# Patient Record
Sex: Female | Born: 1985 | Race: White | Hispanic: Yes | Marital: Single | Smoking: Never smoker
Health system: Southern US, Community
[De-identification: ages and names within clinical notes are randomized; demographics above are authoritative.]

## PROBLEM LIST (undated history)

## (undated) ENCOUNTER — Inpatient Hospital Stay (HOSPITAL_COMMUNITY): Payer: Self-pay

## (undated) DIAGNOSIS — Z789 Other specified health status: Secondary | ICD-10-CM

## (undated) HISTORY — DX: Other specified health status: Z78.9

## (undated) HISTORY — PX: NO PAST SURGERIES: SHX2092

---

## 2012-09-24 NOTE — L&D Delivery Note (Signed)
Delivery Note At 6:45 AM a viable and healthy female was delivered via Vaginal, Spontaneous Delivery (Presentation: Left Occiput Transverse).  APGAR: 8, 9; weight .   Placenta status: Intact, Spontaneous.  Cord: 3 vessels with the following complications: None.  Cord pH: n/a  Anesthesia: Local for repair Episiotomy: None Lacerations: 2nd degree;Perineal Suture Repair: 3.0 vicryl Est. Blood Loss (mL): 300  Mom to postpartum.  Baby to nursery-stable.  Levert Feinstein 02/03/2013, 7:27 AM

## 2012-09-24 NOTE — L&D Delivery Note (Signed)
I have seen the patient with the resident/student and agree with the above.  [name], [name]  

## 2013-01-13 ENCOUNTER — Ambulatory Visit: Payer: Self-pay

## 2013-01-13 ENCOUNTER — Encounter: Payer: Self-pay | Admitting: Obstetrics & Gynecology

## 2013-01-13 DIAGNOSIS — Z3201 Encounter for pregnancy test, result positive: Secondary | ICD-10-CM

## 2013-01-13 LAB — POCT PREGNANCY, URINE: Preg Test, Ur: POSITIVE — AB

## 2013-01-28 ENCOUNTER — Ambulatory Visit (INDEPENDENT_AMBULATORY_CARE_PROVIDER_SITE_OTHER): Payer: Self-pay | Admitting: Family Medicine

## 2013-01-28 ENCOUNTER — Other Ambulatory Visit: Payer: Self-pay | Admitting: Obstetrics & Gynecology

## 2013-01-28 ENCOUNTER — Encounter: Payer: Self-pay | Admitting: Family Medicine

## 2013-01-28 VITALS — BP 108/67 | Temp 98.5°F | Wt 148.3 lb

## 2013-01-28 DIAGNOSIS — O0930 Supervision of pregnancy with insufficient antenatal care, unspecified trimester: Secondary | ICD-10-CM | POA: Insufficient documentation

## 2013-01-28 DIAGNOSIS — O0933 Supervision of pregnancy with insufficient antenatal care, third trimester: Secondary | ICD-10-CM

## 2013-01-28 NOTE — Progress Notes (Signed)
Pulse 86 Sure of lmp. Has not had any prenatal care or ultrasounds.  1hr gtt due today 1704. Needs all initial labs.

## 2013-01-28 NOTE — Patient Instructions (Addendum)
Llame a la clinica para su proxima cita medica. Debe volver en una semana.  Embarazo  Systems analyst trimestre  (Pregnancy - Third Trimester) El tercer trimestre del Psychiatrist (los ltimos 3 meses) es el perodo en el cual tanto usted como su beb crecen con ms rapidez. El beb alcanza un largo de aproximadamente 50 cm. y pesa entre 2,700 y 4,500 kg. El beb gana ms tejido graso y est listo para la vida fuera del cuerpo de la [place]. Mientras estn en el interior, los bebs tienen perodos de sueo y vigilia, Warehouse manager y tienen hipo. Quizs sienta pequeas contracciones del tero. Este es el falso trabajo de [place]. Tambin se las conoce como contracciones de [place] . Es como una prctica del parto. Los problemas ms habituales de esta etapa del embarazo incluyen mayor dificultad para respirar, hinchazn de las manos y los pies por retencin de lquidos y la necesidad de Geographical information systems officer con ms frecuencia debido a que el tero y el beb presionan sobre la vejiga.  EXAMENES PRENATALES   Durante los Manpower Inc, deber seguir realizndose anlisis de [place]. Estas pruebas se realizan para controlar su salud y la del beb. Los ARAMARK Corporation de sangre se Radiographer, therapeutic para The Northwestern Mutual niveles de algunos compuestos de la sangre (hemoglobina). La anemia (bajo nivel de hemoglobina) es frecuente durante el embarazo. Para prevenirla, se administran hierro y vitaminas. Tambin le tomarn nuevas anlisis para descartar diabetes. Podrn repetirle algunas de las Hovnanian Enterprises hicieron previamente.  En cada visita le medirn el tamao del tero. Esto permite asegurar que el beb se desarrolla adecuadamente, segn la fecha del embarazo.  Le controlarn la presin arterial en cada visita prenatal. Esto es para asegurarse de que no sufre toxemia.  Le harn un anlisis de orina en cada visita prenatal, para descartar infecciones, diabetes y la presencia de protenas.  Tambin en cada visita controlarn su peso. Esto se  realiza para asegurarse que aumenta de peso al ritmo indicado y que usted y su beb evolucionan normalmente.  En algunas ocasiones se realiza una prueba de ultrasonido para confirmar el correcto desarrollo y evolucin del beb. Esta prueba se realiza con ondas sonoras inofensivas para el beb, de modo que el profesional pueda calcular ms precisamente la fecha del [place].  Analice con su mdico los analgsicos y la anestesia que recibir durante el [place] de parto y [place].  Comente la posibilidad de que necesite una cesrea y qu anestesia se recibir.  Informe a su mdico si sufre violencia familiar mental o fsica. A veces, se indica la prueba especializada sin estrs, la prueba de tolerancia a las contracciones y el perfil biofsico para asegurarse de que el beb no tiene problemas. El estudio del lquido amnitico que rodea al beb se llama amniocentesis. El lquido amnitico se obtiene introduciendo una aguja en el vientre (abdomen ). En ocasiones se lleva a cabo cerca del final del embarazo, si es necesario inducir a un parto. En este caso se realiza para asegurarse que los pulmones del beb estn lo suficientemente maduros como para que pueda vivir fuera del tero. Si los pulmones no han madurado y es peligroso que el beb nazca, se Building services engineer a la madre una inyeccin de [place] , 1 a 2 [address]  [address] antes del [address]. Vivia Budge ayuda a que los pulmones del beb maduren y sea ms seguro su nacimiento.  CAMBIOS QUE OCURREN EN EL TERCER TRIMESTRE DEL EMBARAZO  Su organismo atravesar numerosos cambios durante el [place]. Estos pueden variar de  Neomia Dear persona a Educational psychologist. Converse con el profesional que la asiste acerca los cambios que usted note y que la preocupen.   Durante el ltimo trimestre probablemente sienta un aumento del apetito. Es normal tener "antojos" de Development worker, community. Esto vara de Neomia Dear persona a otra y de un embarazo a Therapist, art.  Podrn aparecer las primeras estras en las caderas, abdomen y [place].  Estos son cambios normales del cuerpo durante el [place]. No existen medicamentos ni ejercicios que puedan prevenir CarMax.  La constipacin puede tratarse con un laxante o agregando fibra a su dieta. Beber grandes cantidades de lquidos, tomar fibras en forma de vegetales, frutas y granos integrales es de gran [place].  Tambin es beneficioso practicar actividad fsica. Si ha sido una persona Engineer, mining, podr continuar con la Harley-Davidson de las actividades durante el mismo. Si ha sido [employer] Family Insurance, puede ser beneficioso que comience con un programa de ejercicios, Museum/gallery exhibitions officer. Consulte con el profesional que la asiste antes de comenzar un programa de ejercicios.  Evite el consumo de cigarrillos, el alcohol, los medicamentos no recetados y las "drogas de la calle" durante el Psychiatrist. Estas sustancias qumicas afectan la formacin y el desarrollo del beb. Evite estas sustancias durante todo el embarazo para asegurar el nacimiento de un beb sano.  Podr sentir dolor de espalda, tener vrices en las venas y hemorroides, o si ya los sufra, pueden [place].  Durante el tercer trimestre se cansar con ms facilidad, lo cual es normal.  Los movimientos del beb pueden ser ms fuertes y con ms frecuencia.  Puede que note dificultades para respirar normalmente.  El ombligo puede salir hacia afuera.  A veces sale Veterinary surgeon de las [place], que se llama Product manager.  Podr aparecer Neomia Dear secrecin mucosa con sangre. Esto suele ocurrir General Electric unos [address] y Neomia Dear semana antes del [place]. INSTRUCCIONES PARA EL CUIDADO EN EL HOGAR   Cumpla con las citas de control. Siga las indicaciones del mdico con respecto al uso de [place], los ejercicios y la dieta.  Durante el embarazo debe obtener nutrientes para usted y para su beb. Consuma alimentos balanceados a intervalos regulares. Elija alimentos como carne, pescado, [place] y otros productos lcteos descremados,  vegetales, frutas, panes integrales y cereales. El Office Depot informar cul es el aumento de peso ideal.  Las relaciones sexuales pueden continuarse hasta casi el final del embarazo, si no se presentan otros problemas como prdida prematura (antes de [place]) de lquido amnitico, hemorragia vaginal o dolor en el vientre (abdominal).  Realice Tesoro Corporation, si no tiene restricciones. Consulte con el profesional que la asiste si no sabe con certeza si determinados ejercicios son seguros. El mayor aumento de peso se producir en los ltimos 2 trimestres del Psychiatrist. El ejercicio ayuda a:  Engineering geologist.  Mantenerse en forma para el trabajo de parto y [place] .  Perder peso despus del parto.  Haga reposo con frecuencia, con las piernas elevadas, o segn lo necesite para evitar los calambres y el dolor de cintura.  Use un buen sostn o como los que se usan para hacer deportes para Paramedic la sensibilidad de las [place]. Tambin puede serle til si lo [place] mientras duerme. Si pierde Product manager, podr Parker Hannifin.  No utilice la baera con agua caliente, baos turcos y saunas.  Colquese el cinturn de seguridad cuando conduzca. Este la proteger a usted y al beb en caso de accidente.  Evite comer  carne cruda y el contacto con los utensilios y desperdicios de los gatos. Estos elementos contienen grmenes que pueden causar defectos de nacimiento en el beb.  Es fcil perder algo de orina durante el [place]. Apretar y Chief Operating Officer los msculos de la pelvis la ayudar con este problema. Practique detener la miccin cuando est en el bao. Estos son los mismos msculos que Development worker, international aid. Son TEPPCO Partners mismos msculos que utiliza cuando trata de evitar despedir gases. Puede practicar apretando estos msculos WellPoint, y repetir esto tres veces por da aproximadamente. Una vez que conozca qu msculos debe apretar, no realice estos ejercicios durante la  miccin. Puede favorecerle una infeccin si la orina vuelve hacia atrs.  Pida ayuda si tienen necesidades financieras, teraputicas o nutricionales. El profesional podr ayudarla con respecto a estas necesidades, o derivarla a otros especialistas.  Haga una lista de nmeros telefnicos de emergencia y tngalos disponibles.  Planifique como obtener ayuda de familiares o amigos cuando regrese a Programmer, applications hospital.  Hacer un ensayo sobre la partida al hospital.  Prompton clases prenatales con el padre para entender, practicar y hacer preguntas sobre el [place] de parto y el alumbramiento.  Preparar la habitacin del beb / busque Fatima Blank.  No viaje fuera de la ciudad a menos que sea absolutamente necesario y con el asesoramiento de su mdico.  Use slo zapatos de tacn bajo o sin tacn para tener mejor equilibrio y Automotive engineer cadas. USO DE MEDICAMENTOS Y CONSUMO DE DROGAS DURANTE EL [facility]   Tome las vitaminas apropiadas para esta etapa tal como se le indic. Las vitaminas deben contener un miligramo de cido flico. Guarde todas las vitaminas fuera del alcance de los nios. La ingestin de slo un par de vitaminas o tabletas que contengan hierro pueden ocasionar la Newmont Mining en un beb o en un nio pequeo.  Evite el uso de The Mutual of Omaha, incluyendo hierbas, medicamentos de [place], sin receta o que no hayan sido sugeridos por su mdico. Slo tome medicamentos de venta libre o medicamentos recetados para Chief Technology Officer, Environmental health practitioner o fiebre como lo indique su mdico. No tome aspirina, ibuprofeno (Motrin, Advil, Nuprin) o naproxeno (Aleve) excepto que su mdico se lo indique.  Infrmele al profesional si consume alguna droga.  El alcohol se relaciona con ciertos defectos congnitos. Incluye el sndrome de alcoholismo fetal. Debe evitar absolutamente el consumo de alcohol, en cualquier forma. El fumar produce baja tasa de natalidad y bebs prematuros.  Las drogas ilegales o de la  calle son muy perjudiciales para el beb. Estn absolutamente prohibidas. Un beb que nace de American Express, ser adicto al nacer. Ese beb tendr los mismos sntomas de abstinencia que un adulto. SOLICITE ATENCIN MDICA SI:  Tiene preguntas o preocupaciones relacionadas con el embarazo. Es mejor que llame para formular las preguntas si no puede esperar hasta la prxima visita, que sentirse preocupada por ellas.  DECISIONES ACERCA DE LA CIRCUNCISIN  Usted puede saber o no cul es el sexo de su beb. Si ya sabe que ser un varn, este es el momento de pensar acerca de la circuncisin. La circuncisin es la extirpacin del prepucio. Esta es la piel que cubre el extremo sensible del pene. No hay un motivo mdico que lo justifique. Generalmente la decisin se toma segn lo que sea popular en ese momento, o segn creencias religiosas. Podr conversar estos temas con su mdico o con el pediatra.  SOLICITE ATENCIN MDICA DE INMEDIATO SI:   La temperatura oral  le sube a ms de 102 F (38.9 C) o lo que su mdico le indique.  Tiene una prdida de lquido por la vagina (canal de parto). Si sospecha una ruptura de las [place], tmese la temperatura y llame al profesional para informarlo sobre esto.  Observa unas pequeas manchas, una hemorragia vaginal o elimina cogulos. Notifique al profesional acerca de la cantidad y de cuntos apsitos est utilizando.  Presenta un olor desagradable en la secrecin vaginal y observa un cambio en el color, de transparente a blanco.  Ha vomitado durante ms de 24 horas.  Siente escalofros o le sube la fiebre.  Le falta el aire.  Siente ardor al Beatrix Shipper.  Baja o sube ms de 2 libras (900 g), o segn lo indicado por el profesional que la asiste.  Observa que sbitamente se le hinchan el rostro, las manos, los pies o las piernas.  Siente dolor en el vientre (abdominal). Las Federal-Mogul en el ligamento redondo son Neomia Dear causa benigna frecuente de dolor abdominal  durante el embarazo. El profesional que la asiste deber evaluarla.  Presenta dolor de cabeza intenso que no se [place].  Tiene problemas visuales, visin doble o borrosa.  Si no siente los movimientos del beb durante ms de 1 hora. Si piensa que el beb no se mueve tanto como lo haca habitualmente, coma algo que Psychologist, clinical y Target Corporation lado izquierdo durante [place]. El beb debe moverse al menos 4  5 veces por hora. Comunquese inmediatamente si el beb se mueve menos que lo indicado.  Se cae, se ve involucrada en un accidente automovilstico o sufre algn tipo de traumatismo.  En su hogar hay violencia mental o fsica. Document Released: 06/20/2005 Document Revised: 03/11/2012 [facility] Patient Information 2013 [place], [place].  Ihor Dow y parto normal (Normal Labor and Delivery) Licensed conveyancer, su mdico debe estar seguro de que usted est en trabajo de [place]. Algunos signos son:  Puede haber eliminado el "tapn mucoso" antes que comience el trabajo de [place]. Se trata de una pequea cantidad de mucus con sangre.  Tiene contracciones uterinas regulares.  El Bank of America las contracciones se acorta.  Las molestias y Chief Technology Officer se hacen gradualmente ms intensos.  El dolor se ubica principalmente en la espalda.  Los dolores empeoran al Home Depot.  El cuello del tero (la apertura del tero se hace ms delgada, comienza a borrarse, y se abre (se dilata). Una vez que se encuentre en [place] parto y sea admitida en el hospital, el mdico har lo siguiente:  Un examen fsico completo.  Controlar sus signos vitales (presin arterial, pulso, temperatura y la frecuencia cardaca fetal).  Realizar un examen vaginal (usando un guante estril y lubricante para determinar:  La posicin (presentacin) del beb (ceflica [vertex] o nalgas primero).  El nivel (plano) de la cabeza del beb en el canal de parto.  El borramiento y dilatacin del cuello del  tero.  Le rasurarn el vello pbico y le aplicarn una enema segn lo considere el mdico y las circunstancias.  Generalmente se coloca un monitor electrnico sobre el abdomen. El monitor sigue la duracin e intensidad de las contracciones, as como la frecuencia cardaca del beb.  Generalmente, el profesional inserta una va intravenosa en el brazo para administrarle agua azucarada. Esta es una medida de precaucin, de modo que puedan administrarle rpidamente medicamentos durante el [place] de [place]. EL TRABAJO DE PARTO Y PARTO NORMALES SE DIVIDEN EN 3 ETAPAS: Primera etapa Comienzan las contracciones regulares  y el cuello comienza a borrarse y dilatarse. Esta etapa puede durar entre 3 y 15 horas. El final de la primera etapa se considera cuando el cuello est borrado en un 100% y se ha dilatado 10 cm. Le administrarn analgsicos por:  Inyeccin (morfina, demerol, etc.).  Anestesia regional (espinal, caudal o epidural, anestsicos colocados en diferentes regiones de la columna vertebral). Podrn administrarle medicamentos para el dolor en la regin paracervical, que consiste en la aplicacin de un anestsico inyectable en cada uno de los lados del cuello del tero. La embarazada puede requerir un "parto natural" , es decir no recibir United Parcel o anestesia durante el [place] de parto y [place]. Segunda etapa En este momento el beb baja a travs del canal de parto (vagina) y nace. Esto puede durar entre 1 y 4 horas. A medida que el beb asoma la cabeza por el canal de parto, podr sentir una sensacin similar a cuando mueve el intestino. Sentir el impulse de empujar con fuerza hasta que el nio salga. A medida que la cabecita baja, el mdico decidir si realiza una episiotoma (corte en el perineo y rea de la vagina) para evitar la ruptura de los tejidos). Luego del nacimiento del beb y la expulsin de la placenta, la episiotoma se sutura. En algunos casos se coloca a la madre una mscara  con xido nitroso para Research officer, political party respiracin y Engineer, materials. El final de la etapa 2 se produce cuando el beb ha salido completamente. Luego, cuando el cordn umbilical deja de pulsar, se pinza y se corta. Tercera etapa La tercera etapa comienza luego que el beb ha nacido y finaliza luego de la expulsin de la placenta. Generalmente esto lleva entre 5 y 30 minutos. Luego de la expulsin de la placenta, le aplicarn un medicamento por va intravenosa para ayudar a Engineer, materials y Psychiatric nurse. En la tercera etapa no hay dolor y generalmente no son necesarios los analgsicos. Si le han realizado una episiotoma, es el momento de Sales promotion account executive. Luego del parto, la mam es observada y controlada exhaustivamente durante 1  2 horas para verificar que no hay sangrado en el post parto (hemorragias). Si pierde The Progressive Corporation, le administrarn un medicamento para Engineer, manufacturing tero y Comptroller. Document Released: 08/23/2008 Document Revised: 12/03/2011  [facility] Patient Information 2013 [place], [place].  Uso de los anticonceptivos orales  (Oral Contraception Use) Los anticonceptivos orales son medicamentos que se utilizan para Location manager. Su funcin es ALLTEL Corporation ovarios liberen vulos. Las hormonas de los anticonceptivos orales hacen que el moco cervical se haga ms espeso, lo que evita que el esperma ingrese al tero. Tambin hacen que la membrana que tapiza el tero se vuelva ms fina, lo que no permite que el huevo fertilizado se adhiera a la pared del tero. Los anticonceptivos orales son muy efectivos cuando se toman exactamente como se prescriben. Sin embargo, los anticonceptivos orales no previenen contra las enfermedades de transmisin sexual (ETS). La prctica del sexo seguro, como el uso de preservativos, junto con los anticonceptivos [place], [place] a prevenir ese tipo de enfermedades. Antes de tomar la pldora, usted debe hacerse un examen fsico y un [place]. El  mdico podr indicarle anlisis de [place], si es necesario. El mdico se asegurar de que usted es una buena candidata para usar anticonceptivos orales. Converse con su mdico acerca de los posibles efectos secundarios de los anticonceptivos [place]. Cuando se inicia el uso de anticonceptivos [place], se pueden tomar durante 2 a  3 meses para que el cuerpo se adapte a los cambios en los niveles hormonales en el cuerpo.  CMO TOMAR LOS ANTICONCEPTIVOS ORALES  El mdico le indicar como comenzar a Surveyor, minerals ciclo de anticonceptivos orales. De lo contrario usted puede:   Psychiatric nurse. da del ciclo menstrual. No necesitar proteccin anticonceptiva adicional al Investment banker, operational.  Comenzar Financial risk analyst domingo luego de su perodo menstrual, o Medical laboratory scientific officer en que adquiere el Automatic Data. En estos casos deber EchoStar proteccin anticonceptiva The TJX Companies primeros 7 das del [place]. Luego de comenzar a tomar los anticonceptivos orales:   Si olvid de tomar 1 pldora, tmela tan pronto como lo recuerde. Tome la siguiente pldora a la hora habitual.  Si olvida tomar 2  ms pldoras, utilice un mtodo anticonceptivo adicional hasta que comience su prximo perodo menstrual.  Si utiliza el envase de 28 pldoras y [place] tomar 1 de las ltimas 7 (pldoras sin hormonas), sto no tiene [place]ar[place] manager. Simplemente deseche el resto de las pldoras que no contienen hormonas y comience un nuevo envase. No importa cuando comience a tomar los anticonceptivos, siempre empiece un nuevo envase el mismo da de la [place]. Tenga un envase extra de pldoras anticonceptivas y use un mtodo anticonceptivo adicional para el caso en que se olvide de tomar algunas pldoras o pierda la caja.  INSTRUCCIONES PARA EL CUIDADO DOMICILIARIO  No fume.  Siempre use un condn para protegerse de las enfermedades de transmisin sexual. Los anticonceptivos orales no protegen contra las enfermedades de transmisin sexual.  Research scientist (medical)  en un calendario las fechas en las que tiene sus perodos [place].  Lea la informacin y consejos que vienen con las pldoras. Pngase en contacto con el mdico siempre que tenga preguntas. SOLICITE ATENCIN MDICA SI:  Presenta nuseas o vmitos.  Tiene flujo o sangrado vaginal anormal.  Aparece una erupcin cutnea.  No tiene el perodo menstrual.  Pierde el cabello.  Necesita tratamiento por cambios en su estado de nimo o por depresin.  Se siente mareada al tomar la pldora.  Comienza a aparecer acn con el uso de los anticonceptivos orales.  Ardelle Anton. SOLICITE ATENCIN MDICA DE INMEDIATO SI:  Siente dolor en el pecho.  Le falta el aire.  Le duele mucho la cabeza y no puede Human resources officer.  Siente adormecimiento o tiene dificultad para hablar.  Tiene problemas de visin.  Presenta dolor, inflamacin o hinchazn en las piernas. Document Released: 08/30/2011 Document Revised: 12/03/2011 [facility] Patient Information 2013 [place], [place].  Lactancia materna  (Breastfeeding) Decidir Museum/gallery exhibitions officer es una de las mejores elecciones que puede hacer por usted y su beb. La informacin que se brinda a Psychologist, clinical dar una breve visin de los beneficios de la lactancia materna as como de las dudas ms frecuentes alrededor de ella.  LOS BENEFICIOS DE AMAMANTAR  Para el beb   La primera leche (calostro ) ayuda al mejor funcionamiento del sistema digestivo del beb.   La leche tiene anticuerpos que provienen de la madre y que ayudan a prevenir las infecciones en el beb.   El beb tiene una menor incidencia de asma, alergias y del sndrome de muerte sbita del lactante (SMSL).   Los nutrientes en la [place] materna son mejores para el beb que los preparados para lactantes y la [place] materna ayuda a un mejor desarrollo del cerebro del beb.   Los bebs amamantados tienen menos gases, clicos y estreimiento.  Para la mam   La lactancia materna  favorece  el desarrollo de un vnculo muy especial entre la madre y el beb.   Es ms conveniente, siempre disponible y a Landscape architect y Film/video editor.   Consume caloras en la madre y la ayuda a perder el peso ganado durante el [place].   Favorece la contraccin del tero a su tamao normal, de manera ms rpida y Berkshire Hathaway las hemorragias luego del [place].   Las M.D.C. Holdings que amamantan tienen menor riesgo de Geophysical data processor de mama.  FRECUENCIA DEL AMAMANTAMIENTO   Un beb sano, nacido a trmino, puede amamantarse con tanta frecuencia como cada hora, o espaciar las comidas cada tres horas.   Observe al beb cuando manifieste signos de hambre. Amamante a su beb si muestra signos de hambre. Esta frecuencia variar de un beb a otro.   Amamntelo tan seguido como el beb lo solicite, o cuando usted sienta la necesidad de Paramedic sus [place].   Despierte al beb si han pasado 3  4 horas desde la ltima comida.   El amamantamiento frecuente la ayudar a producir ms [place] y a Education officer, community de Engineer, mining en los pezones e hinchazn de las [place].  POSICIN DEL BEBE PARA EL AMAMANTAMIENTO   Ya sea que se encuentre [place] o sentada, asegrese que el abdomen del beb enfrente el suyo.   Sostenga la mama con el pulgar por arriba y los otros 4 dedos por debajo. Asegrese que sus dedos se encuentren lejos del pezn y de la boca del beb.   Empuje suavemente los labios del beb con el pezn o con el dedo.   Cuando la boca del beb se abra lo suficiente, introduzca el pezn y la areola tanto como le sea posible dentro de la boca.   Coloque al beb cerca suyo de modo que su nariz y mejillas toquen las mamas al Texas Instruments.  ALIMENTACIN Y SUCCIN   La duracin de cada comida vara de un beb a otro y de Neomia Dear comida a [place].   El beb debe succionar entre 2 y 3 minutos para que Development worker, international aid. Esto se denomina "bajada". Por este motivo, permita que el nio se alimente en cada mama  todo lo que desee. Terminar de mamar cuando haya recibido la cantidad [place] de nutrientes.   Para detener la succin coloque su dedo en la comisura de la boca del nio y Midwife entre sus encas antes de quitarle la mama de la boca. Esto la ayudar a English as a second language teacher.  COMO SABER SI EL BEB OBTIENE LA SUFICIENTE LECHE MATERNA  Preguntarse si el beb obtiene la cantidad suficiente de [place] es una preocupacin frecuente [employer]. Puede asegurarse que el beb tiene la leche suficiente si:   El beb succiona activamente y usted escucha que traga .   El beb parece estar relajado y satisfecho despus de Psychologist, clinical.   El nio se alimenta al menos 8 a 12 veces en 24 horas. Alimntelo hasta que se desprenda por sus propios medios o se quede dormido en la primera mama (al menos durante 10 a 20 minutos), luego ofrzcale el otro lado.   El beb moja 5 a 6 paales desechables (6 a 8 paales de tela) en 24 horas cuando tiene 5  6 das de vida.   Tiene al [employer] 3 a 4 deposiciones todos los [employer] primeros meses. La materia fecal debe ser blanda y [place].   El beb debe aumentar 4 a 6 libras (120 a 170 gr.) por semana despus  de los 4 [address]   Siente que las mamas se ablandan despus de amamantar  REDUCIR LA CONGESTIN DE LAS MAMAS   Durante la primera semana despus del [place], usted puede experimentar hinchazn en las mamas. Cuando las mamas estn congestionadas, se sienten calientes, llenas y molestas al tacto. Puede reducir la congestin si:   Lo amamanta frecuentemente, cada 2-3 horas. Las mams que [employer] pronto y con frecuencia tienen menos problemas de [place].   Coloque compresas de hielo en sus mamas durante 10-20 minutos entre cada amamantamiento. Esto ayuda a Building services engineer. Envuelva las bolsas de hielo en una toalla liviana para proteger su piel. Las bolsas de vegetales congelados funcionan bien para este propsito.   Tome una  ducha tibia o aplique compresas hmedas calientes en las mamas durante 5 a 10 minutos antes de cada vez que [place]. Esto aumenta la circulacin y [place] a que la [place].   Masajee suavemente la mama antes y Psychologist, sport and exercise. Con las puntas de los dedos, masajee desde la pared torcica hacia abajo hasta llegar al pezn, con movimientos circulares.   Asegrese que el nio vaca al menos una mama antes de cambiar de lado.   Use un sacaleche para vaciar la mama si el beb se duerme o no se alimenta bien. Tambin podr [employer] con esa bomba si tiene que volver al trabajo o siente que las mamas estn congestionadas.   Evite los biberones, chupetes o complementar la alimentacin con agua o jugos en lugar de la [place]. La leche materna es todo el alimento que el beb necesita. No es necesario que el nio ingiera agua o preparados de bibern. Louann [place], es lo mejor para ayudar a que las mamas produzcan ms [place]. no darle suplementos al [employer] las primeras semanas.   Verifique que el beb se encuentra en la posicin correcta mientras lo alimenta.   Use un sostn que soporte bien sus mamas y [employer] que tienen aro.   Consuma una dieta balanceada y beba lquidos en cantidad.   Descanse con frecuencia, reljese y tome sus vitaminas prenatales para evitar la fatiga, el estrs y la anemia.  Si sigue estas indicaciones, la congestin debe mejorar en 24 a 48 horas. Si an tiene dificultades, consulte a Barista.  CUDESE USTED MISMA  Cuide sus mamas.   Bese o dchese diariamente.   Evite usar [employer].   Comience a amamantar del lado izquierdo en una comida y del lado derecho en la siguiente.   Notar que aumenta el flujo de [place] a los 2 a 5 [address]  [address] despus del [address]. Puede sentir algunas molestias por la congestin, lo que hace que sus mamas estn duras y sensibles. La congestin disminuye en 24 a 48 horas. Mientras tanto, aplique  toallas hmedas calientes durante 5 a 10 minutos antes de amamantar. Un masaje suave y la extraccin de un poco de leche antes de Museum/gallery exhibitions officer ablandarn las mamas y har ms fcil que el beb se agarre.   Use un buen sostn y seque al aire los pezones durante 3 a 4 minutos luego de Wellsite geologist.   Solo utilice apsitos de algodn.   Utilice lanolina [employer] pezones luego de [place]. No necesita lavarlos luego de alimentar al [employer]. Otra opcin es exprimir algunas gotas de [place] y [employer] pezones.  Cumpla con estos cuidados   Consuma alimentos bien balanceados y refrigerios nutritivos.   Albesa Seen  leche, jugos de fruta y agua para Warehouse manager sed (alrededor de 8 vasos por Futures trader).   Descanse lo suficiente.  Evite los alimentos que usted note que pueden afectar al beb.  SOLICITE ATENCIN MDICA SI:   Tiene dificultad con la lactancia materna y [place].   Tiene una zona de color rojo, dura y dolorosa en la mama que se acompaa de [place].   El beb est muy somnoliento como para alimentarse bien o tiene problemas para dormir.   Su beb moja menos de 6 paales al [address], a los 5 [address]  [address] de vida.   La piel del beb o la parte blanca de sus ojos est ms amarilla de lo que estaba en el hospital.   Se siente deprimida.  Document Released: 09/10/2005 Document Revised: 03/11/2012 [facility] Patient Information 2013 [place], [place].

## 2013-01-28 NOTE — Progress Notes (Signed)
  SUBJECTIVE:   Katelyn Joseph is being seen today for her first obstetrical visit.  This is a planned pregnancy. She is at [redacted]w[redacted]d gestation by LMP 05/16/12 (regular periods, sure of dates). Given EDD of 02/18/13. Her obstetrical history is significant for late prenatal care. She just moved here from [place] where she has prenatal care starting at 1 month of pregnancy. Her last prenatal visit there was 12/24/12. No ultrasounds in [place]. Relationship with FOB: significant other, living together. Patient does intend to breast feed. Pregnancy history fully reviewed.  Denies contractions, bleeding, loss of fluid, vaginal discharge, dysuria, nausea, vomiting. Does have occasional reflux, history of gastritis. No diarrhea or constipation. No fever/chills. Menstrual History: OB History   Grav Para Term Preterm Abortions TAB SAB Ect Mult Living   1               Patient's last menstrual period was 05/16/2012.   Has never had a pap smear. Denies history of STDs or herpes.  I have reviewed past medical and surgical history, OB history, family and social history, medications and allergies.  Review of Systems Pertinent positives and negatives mentioned in HPI.    Objective:   GEN:  WNWD, no distress HEENT:  NCAT, EOMI, conjunctiva clear NECK:  Supple, non-tender, no thyromegaly, trachea midline CV: RRR, no murmur RESP:  CTAB ABD:  Soft, non-tender, no guarding or rebound, normal bowel sounds EXTREM:  Warm, well perfused, no edema or tenderness NEURO:  Alert, oriented, no focal deficits GU:  Normal external genitalia. Normal vagina. Minimal vaginal discharge. Normal non-parous cervix. No CMT or adnexal tenderness. External os dilated to 1 cm. Cervix fairly long and high.  Assessment:    Pregnancy at [redacted]w[redacted]d  Late prenatal care    Plan:    Initial labs drawn including 1 hour GTT Prenatal vitamins. Problem list reviewed and updated. AFP3 discussed: too late. Role of ultrasound in  pregnancy discussed; fetal survey: ordered. Amniocentesis discussed: not indicated. Follow up in 1 weeks. Labor precautions reviewed. 50% of 45 min visit spent on counseling and coordination of care.

## 2013-01-29 LAB — GLUCOSE TOLERANCE, 1 HOUR (50G) W/O FASTING: Glucose, 1 Hour GTT: 93 mg/dL (ref 70–140)

## 2013-01-30 ENCOUNTER — Ambulatory Visit (HOSPITAL_COMMUNITY)
Admission: RE | Admit: 2013-01-30 | Discharge: 2013-01-30 | Disposition: A | Discharge status: Home / Self Care | Payer: Self-pay | Source: Ambulatory Visit | Attending: Family Medicine | Admitting: Family Medicine

## 2013-01-30 ENCOUNTER — Encounter: Payer: Self-pay | Admitting: Family Medicine

## 2013-01-30 DIAGNOSIS — Z363 Encounter for antenatal screening for malformations: Secondary | ICD-10-CM | POA: Insufficient documentation

## 2013-01-30 DIAGNOSIS — O0930 Supervision of pregnancy with insufficient antenatal care, unspecified trimester: Secondary | ICD-10-CM | POA: Insufficient documentation

## 2013-01-30 DIAGNOSIS — Z1389 Encounter for screening for other disorder: Secondary | ICD-10-CM | POA: Insufficient documentation

## 2013-01-30 DIAGNOSIS — O358XX0 Maternal care for other (suspected) fetal abnormality and damage, not applicable or unspecified: Secondary | ICD-10-CM | POA: Insufficient documentation

## 2013-01-30 DIAGNOSIS — O0933 Supervision of pregnancy with insufficient antenatal care, third trimester: Secondary | ICD-10-CM

## 2013-01-30 LAB — OBSTETRIC PANEL
Antibody Screen: NEGATIVE
Basophils Relative: 0 % (ref 0–1)
HCT: 30.2 % — ABNORMAL LOW (ref 36.0–46.0)
Hemoglobin: 10.2 g/dL — ABNORMAL LOW (ref 12.0–15.0)
Hepatitis B Surface Ag: NEGATIVE
Lymphs Abs: 1.5 10*3/uL (ref 0.7–4.0)
MCH: 27.3 pg (ref 26.0–34.0)
MCHC: 33.8 g/dL (ref 30.0–36.0)
Monocytes Absolute: 0.4 10*3/uL (ref 0.1–1.0)
Monocytes Relative: 5 % (ref 3–12)
Neutro Abs: 4.9 10*3/uL (ref 1.7–7.7)
Neutrophils Relative %: 71 % (ref 43–77)
RBC: 3.74 MIL/uL — ABNORMAL LOW (ref 3.87–5.11)
Rh Type: POSITIVE

## 2013-01-30 LAB — HEMOGLOBINOPATHY EVALUATION: Hemoglobin Other: 0 %

## 2013-01-31 LAB — CULTURE, OB URINE

## 2013-02-01 ENCOUNTER — Encounter: Payer: Self-pay | Admitting: Family Medicine

## 2013-02-03 ENCOUNTER — Encounter (HOSPITAL_COMMUNITY): Payer: Self-pay

## 2013-02-03 ENCOUNTER — Inpatient Hospital Stay (HOSPITAL_COMMUNITY)
Admission: AD | Admit: 2013-02-03 | Discharge: 2013-02-05 | DRG: 775 | Disposition: A | Discharge status: Home / Self Care | Payer: Medicaid Other | Source: Ambulatory Visit | Attending: Obstetrics & Gynecology | Admitting: Obstetrics & Gynecology

## 2013-02-03 DIAGNOSIS — O99893 Other specified diseases and conditions complicating puerperium: Secondary | ICD-10-CM | POA: Diagnosis not present

## 2013-02-03 DIAGNOSIS — H5710 Ocular pain, unspecified eye: Secondary | ICD-10-CM

## 2013-02-03 DIAGNOSIS — H1130 Conjunctival hemorrhage, unspecified eye: Secondary | ICD-10-CM | POA: Diagnosis not present

## 2013-02-03 DIAGNOSIS — O9989 Other specified diseases and conditions complicating pregnancy, childbirth and the puerperium: Secondary | ICD-10-CM

## 2013-02-03 DIAGNOSIS — R233 Spontaneous ecchymoses: Secondary | ICD-10-CM | POA: Diagnosis not present

## 2013-02-03 LAB — TYPE AND SCREEN
ABO/RH(D): AB POS
Antibody Screen: NEGATIVE

## 2013-02-03 LAB — CBC
Hemoglobin: 10.8 g/dL — ABNORMAL LOW (ref 12.0–15.0)
MCH: 27.8 pg (ref 26.0–34.0)
MCHC: 34 g/dL (ref 30.0–36.0)
MCV: 82 fL (ref 78.0–100.0)
RBC: 3.88 MIL/uL (ref 3.87–5.11)

## 2013-02-03 LAB — OB RESULTS CONSOLE GBS: GBS: NEGATIVE

## 2013-02-03 LAB — ABO/RH: ABO/RH(D): AB POS

## 2013-02-03 LAB — RPR: RPR Ser Ql: NONREACTIVE

## 2013-02-03 MED ORDER — ACETAMINOPHEN 325 MG PO TABS: 650.0000 mg | ORAL_TABLET | ORAL | Status: DC | PRN

## 2013-02-03 MED ORDER — ZOLPIDEM TARTRATE 5 MG PO TABS: 5.0000 mg | ORAL_TABLET | Freq: Every evening | ORAL | Status: DC | PRN

## 2013-02-03 MED ORDER — ONDANSETRON HCL 4 MG/2ML IJ SOLN: 4.0000 mg | Freq: Four times a day (QID) | INTRAMUSCULAR | Status: DC | PRN

## 2013-02-03 MED ORDER — FENTANYL CITRATE 0.05 MG/ML IJ SOLN
100.0000 ug | INTRAMUSCULAR | Status: DC | PRN
  Administered 2013-02-03 (×2): 100 ug via INTRAVENOUS
  Filled 2013-02-03 (×2): qty 2

## 2013-02-03 MED ORDER — CITRIC ACID-SODIUM CITRATE 334-500 MG/5ML PO SOLN: 30.0000 mL | ORAL | Status: DC | PRN

## 2013-02-03 MED ORDER — IBUPROFEN 600 MG PO TABS: 600.0000 mg | ORAL_TABLET | Freq: Four times a day (QID) | ORAL | Status: DC | PRN

## 2013-02-03 MED ORDER — TETANUS-DIPHTH-ACELL PERTUSSIS 5-2.5-18.5 LF-MCG/0.5 IM SUSP
0.5000 mL | Freq: Once | INTRAMUSCULAR | Status: AC
  Administered 2013-02-04: 0.5 mL via INTRAMUSCULAR

## 2013-02-03 MED ORDER — SIMETHICONE 80 MG PO CHEW: 80.0000 mg | CHEWABLE_TABLET | ORAL | Status: DC | PRN

## 2013-02-03 MED ORDER — LANOLIN HYDROUS EX OINT: TOPICAL_OINTMENT | CUTANEOUS | Status: DC | PRN

## 2013-02-03 MED ORDER — OXYCODONE-ACETAMINOPHEN 5-325 MG PO TABS: 1.0000 | ORAL_TABLET | ORAL | Status: DC | PRN

## 2013-02-03 MED ORDER — DIPHENHYDRAMINE HCL 25 MG PO CAPS: 25.0000 mg | ORAL_CAPSULE | Freq: Four times a day (QID) | ORAL | Status: DC | PRN

## 2013-02-03 MED ORDER — BENZOCAINE-MENTHOL 20-0.5 % EX AERO
1.0000 "application " | INHALATION_SPRAY | CUTANEOUS | Status: DC | PRN
  Filled 2013-02-03: qty 56

## 2013-02-03 MED ORDER — PRENATAL MULTIVITAMIN CH
1.0000 | ORAL_TABLET | Freq: Every day | ORAL | Status: DC
  Administered 2013-02-03 – 2013-02-05 (×3): 1 via ORAL
  Filled 2013-02-03 (×3): qty 1

## 2013-02-03 MED ORDER — OXYCODONE-ACETAMINOPHEN 5-325 MG PO TABS
1.0000 | ORAL_TABLET | ORAL | Status: DC | PRN
  Administered 2013-02-03: 1 via ORAL
  Filled 2013-02-03: qty 2

## 2013-02-03 MED ORDER — LIDOCAINE HCL (PF) 1 % IJ SOLN
30.0000 mL | INTRAMUSCULAR | Status: AC | PRN
  Administered 2013-02-03: 30 mL via SUBCUTANEOUS
  Filled 2013-02-03 (×2): qty 30

## 2013-02-03 MED ORDER — IBUPROFEN 600 MG PO TABS
600.0000 mg | ORAL_TABLET | Freq: Four times a day (QID) | ORAL | Status: DC
  Administered 2013-02-03 – 2013-02-05 (×10): 600 mg via ORAL
  Filled 2013-02-03 (×10): qty 1

## 2013-02-03 MED ORDER — ONDANSETRON HCL 4 MG PO TABS: 4.0000 mg | ORAL_TABLET | ORAL | Status: DC | PRN

## 2013-02-03 MED ORDER — LACTATED RINGERS IV SOLN: INTRAVENOUS | Status: DC

## 2013-02-03 MED ORDER — OXYTOCIN BOLUS FROM INFUSION
500.0000 mL | INTRAVENOUS | Status: DC
  Administered 2013-02-03: 500 mL via INTRAVENOUS

## 2013-02-03 MED ORDER — LACTATED RINGERS IV SOLN
500.0000 mL | INTRAVENOUS | Status: DC | PRN
Start: 2013-02-03 — End: 2013-02-03

## 2013-02-03 MED ORDER — DIBUCAINE 1 % RE OINT
1.0000 "application " | TOPICAL_OINTMENT | RECTAL | Status: DC | PRN
  Filled 2013-02-03: qty 28

## 2013-02-03 MED ORDER — WITCH HAZEL-GLYCERIN EX PADS: 1.0000 "application " | MEDICATED_PAD | CUTANEOUS | Status: DC | PRN

## 2013-02-03 MED ORDER — SENNOSIDES-DOCUSATE SODIUM 8.6-50 MG PO TABS
2.0000 | ORAL_TABLET | Freq: Every day | ORAL | Status: DC
  Administered 2013-02-03: 2 via ORAL

## 2013-02-03 MED ORDER — ONDANSETRON HCL 4 MG/2ML IJ SOLN: 4.0000 mg | INTRAMUSCULAR | Status: DC | PRN

## 2013-02-03 MED ORDER — OXYTOCIN 40 UNITS IN LACTATED RINGERS INFUSION - SIMPLE MED
62.5000 mL/h | INTRAVENOUS | Status: DC
  Filled 2013-02-03: qty 1000

## 2013-02-03 MED ORDER — POLYVINYL ALCOHOL 1.4 % OP SOLN
1.0000 [drp] | OPHTHALMIC | Status: DC | PRN
  Administered 2013-02-03: 1 [drp] via OPHTHALMIC
  Filled 2013-02-03: qty 15

## 2013-02-03 NOTE — Progress Notes (Signed)
Patient ID: Katelyn Joseph, female   DOB: 12/26/1985, 27 y.o.   MRN: [id number]   S:  Pt with bilateral red eyes and eye pain per RN.  Pt states her vision is fine but her eyes sting/burn.  O:   Filed Vitals:   02/03/13 1326  BP: 105/57  Pulse: 65  Temp: 98 F (36.7 C)  Resp: 20    EXAM:  Gen:  Alert,no distress HEENT:  NCAT Eyes:  Bilateral subconjunctival hemorrhages, normal pupils, EOMI Skin:  Petechial rash on neck and shoulders  A/P 26 y.o. G1P1001 PPD #0 following SVD. - Subjunctival hemorrhages bilaterally likely due to pushing given petechia around neck as well - Artificial tears for comfort, reassurance  Napoleon Form, MD

## 2013-02-03 NOTE — H&P (Signed)
Katelyn Joseph is a 27 y.o. female presenting for painful contractions.  The contractions started around midnight tonight. She was 2cm dilated a few days ago per pt.   This is her first baby. She goes to the clinic at [facility]. Denies having any problems during this pregnancy, or any medical problems in general.  Meds: prenatal vitamins Allergies: amoxicillin (dizzy)  History OB History   Grav Para Term Preterm Abortions TAB SAB Ect Mult Living   1 0 0 0 0 0 0 0 0 0      Past Medical History  Diagnosis Date  . Medical history non-contributory    Past Surgical History  Procedure Laterality Date  . No past surgeries     Family History: family history is not on file. Social History:  reports that she has never smoked. She has never used smokeless tobacco. She reports that she does not drink alcohol or use illicit drugs.   Prenatal Transfer Tool  Maternal Diabetes: No Genetic Screening: Declined - too late Maternal Ultrasounds/Referrals: Normal Fetal Ultrasounds or other Referrals:  None Maternal Substance Abuse:  No Significant Maternal Medications:  None Significant Maternal Lab Results:  None Other Comments:  initiated care at [redacted]w[redacted]d but had some prior care in [place]  ROS +contractions  Last menstrual period 05/16/2012. Exam Physical Exam  Gen: NAD Heart: RRR Lungs: CTAB, NWOB Abd: gravid but otherwise soft, nontender to palpation Ext: no appreciable lower extremity edema bilaterally Neuro: grossly nonfocal, speech intact GU:  Dilation: 5 Effacement (%): 90 Station: -1 Presentation: Vertex Exam by:: N Deal RN  Prenatal labs: ABO, Rh: AB/POS/-- (05/07 1715) Antibody: NEG (05/07 1715) Rubella: 17.50 (05/07 1715) RPR: NON REAC (05/07 1715)  HBsAg: NEGATIVE (05/07 1715)  HIV: NON REACTIVE (05/07 1715)  GBS: Negative (05/13 0000)   Assessment/Plan: Jena Tegeler is a 27 y.o. G1P0000 at [redacted]w[redacted]d who presents in active  labor.  -Admit to birthing suites for expectant management -GBS negative -may have epidural upon request -anticipate NSVD   Levert Feinstein 02/03/2013, 1:55 AM  .I have seen the patient with the resident/student and agree with the above.  Tawnya Crook

## 2013-02-03 NOTE — Progress Notes (Signed)
Interpreter at bedside.  Answered questions regarding care after delivery.  Ordered breakfast for patient.

## 2013-02-03 NOTE — Progress Notes (Signed)
UR chart review completed.  

## 2013-02-04 NOTE — Progress Notes (Signed)
Post Partum Day #1 Subjective: Katelyn Joseph is a 27 y.o. female who is up ad lib, voiding, tolerating PO, + flatus and complains of no vaginal pain.  She is still complaining of eye pain from subconjunctival hemorrages she received while delivering.  She states she has no dizziness on standing.  She plans to breast feed and will receive the depo shot for her birth control.    Objective: Blood pressure 108/69, pulse 59, temperature 98 F (36.7 C), temperature source Oral, resp. rate 18, height 5' 4.17" (1.63 m), weight 61 kg (134 lb 7.7 oz), last menstrual period 05/16/2012, unknown if currently breastfeeding.  Physical Exam:  General: alert, cooperative, appears stated age and no distress HEENT: bilateral subconjunctival hemorrhages, PERRLA CVS: RRR, good S1 and S2, no RGM Lungs: CTA bilaterally, no wheeze, crackles, or rhonchi Abdomen: Normoactive bowel sounds x4, soft with no tenderness to palpation.   Lochia: appropriate Uterine Fundus: firm DVT Evaluation: No evidence of DVT seen on physical exam.   Recent Labs  02/03/13 0135  HGB 10.8*  HCT 31.8*    Assessment/Plan: Plan for discharge tomorrow Will continue with PRN eye drops for eye pain   LOS: 1 day   Anna Genre 02/04/2013, 7:27 AM

## 2013-02-04 NOTE — Progress Notes (Signed)
Pt states that her eyes feel better than they did this morning, through her family friend who is present in the room.  Complains of no pain at this time.

## 2013-02-04 NOTE — Progress Notes (Signed)
I have seen and examined this patient and I agree with the above. Pt declines early d/c. Cam Hai 7:45 AM 02/04/2013

## 2013-02-05 MED ORDER — IBUPROFEN 600 MG PO TABS: 600.0000 mg | ORAL_TABLET | Freq: Four times a day (QID) | ORAL | Status: DC

## 2013-02-05 NOTE — Discharge Summary (Signed)
Attestation of Attending Supervision of Advanced Practitioner (CNM/NP): Evaluation and management procedures were performed by the Advanced Practitioner under my supervision and collaboration.  I have reviewed the Advanced Practitioner's note and chart, and I agree with the management and plan.  HARRAWAY-SMITH, Jena Tegeler 7:11 AM     

## 2013-02-05 NOTE — Progress Notes (Signed)
Pt discharged before CSW could assess the reason for [facility] @ 38 weeks.  UDS is negative.  CSW will continue to monitor drug screen results and make a referral if necessary.

## 2013-02-05 NOTE — H&P (Signed)
Agree with above note.  Katelyn Farino H. 02/05/2013 3:05 PM

## 2013-02-05 NOTE — Discharge Summary (Signed)
Obstetric Discharge Summary Reason for Admission: onset of labor Prenatal Procedures: NST Intrapartum Procedures: spontaneous vaginal delivery Postpartum Procedures: none Complications-Operative and Postpartum: none Hemoglobin  Date Value Range Status  02/03/2013 10.8* 12.0 - 15.0 g/dL Final     HCT  Date Value Range Status  02/03/2013 31.8* 36.0 - 46.0 % Final  Hospital Course: Katelyn Joseph is a 27 y.o. female presenting for painful contractions.  The contractions started around midnight tonight. She was 2cm dilated a few days ago per pt.  This is her first baby. She goes to the clinic at [facility]. Denies having any problems during this pregnancy, or any medical problems in general.  At 6:45 AM a viable and healthy female was delivered via Vaginal, Spontaneous Delivery (Presentation: Left Occiput Transverse). APGAR: 8, 9; weight .  Placenta status: Intact, Spontaneous. Cord: 3 vessels with the following complications: None. Cord pH: n/a  Anesthesia: Local for repair  Episiotomy: None  Lacerations: 2nd degree;Perineal  Suture Repair: 3.0 vicryl  Est. Blood Loss (mL): 300  She has done well postpartum and is ready for discharge  Physical Exam:  General: alert and no distress Lochia: appropriate Uterine Fundus: firm Incision: healing well DVT Evaluation: No evidence of DVT seen on physical exam.  Discharge Diagnoses: Term Pregnancy-delivered  Discharge Information: Date: 02/05/2013 Activity: unrestricted and pelvic rest Diet: routine Medications: PNV and Ibuprofen Condition: stable Instructions: refer to practice specific booklet Discharge to: home   Newborn Data: Live born female  Birth Weight: 6 lb 8.6 oz (2965 g) APGAR: 8, 9  Home with mother.  [facility] 02/05/2013, 6:32 AM

## 2013-02-10 ENCOUNTER — Encounter: Payer: Self-pay | Admitting: Advanced Practice Midwife

## 2013-03-04 ENCOUNTER — Ambulatory Visit: Payer: Self-pay | Admitting: Advanced Practice Midwife

## 2013-04-01 ENCOUNTER — Ambulatory Visit (INDEPENDENT_AMBULATORY_CARE_PROVIDER_SITE_OTHER): Payer: Self-pay | Admitting: Advanced Practice Midwife

## 2013-04-01 MED ORDER — NORETHINDRONE 0.35 MG PO TABS: 1.0000 | ORAL_TABLET | Freq: Every day | ORAL | Status: DC

## 2013-04-01 NOTE — Progress Notes (Signed)
Subjective:     Patient ID: Katelyn Joseph, female   DOB: 1986/02/27, 27 y.o.   MRN: [id number]  HPI  Katelyn Joseph is a 27 y.o. G1P1001 who is here today for PP check s/p NSVB. She had a perineal repair and denies any pain at this time. She states that her bleeding has stopped now, and that she bled for 20 days total. She is breastfeeding and states that is going well. She has not resumed intercourse at this time. She denies any depressive symptoms. She is planning on using pills for birth control.  Review of Systems     Objective:   Physical Exam  Nursing note and vitals reviewed. Constitutional: She is oriented to person, place, and time. She appears well-developed and well-nourished. No distress.  Cardiovascular: Normal rate.   Pulmonary/Chest: Effort normal.  Abdominal: Soft. There is no tenderness.  Genitourinary:  External: no lesion, repair is well healed.     Neurological: She is alert and oriented to person, place, and time.  Skin: Skin is warm and dry.  Psychiatric: She has a normal mood and affect.       Assessment:    6 weeks S/P NSVB Desires contraception     Plan:     [facility] Will come in for new BC if/when she weans

## 2013-04-01 NOTE — Patient Instructions (Signed)
Norethindrone tablets (contraception) Qu es este medicamento? La NORETINDRONA es un anticonceptivo oral. Este producto contiene una hormona femenina llamada progestina. Se utilizan para Location managerevitar el embarazo. Este medicamento puede ser utilizado para otros usos; si tiene alguna pregunta consulte con su proveedor de atencin mdica o con su farmacutico. Qu le debo informar a mi profesional de la salud antes de tomar este medicamento? Necesita saber si usted presenta alguno de los siguientes problemas o situaciones: -enfermedad vascular o cogulos sanguneos -cncer de mama, cervical o vaginal -diabetes -enfermedad cardiaca -enfermedad renal -enfermedad heptica -depresin mental -migraa -convulsiones -derrame cerebral -sangrado vaginal -reaccin alrgica o inusual a la noretindrona, a otros medicamentos, alimentos, colorantes o conservantes -si est embarazada o buscando quedar embarazada -si est amamantando a un beb Cmo debo utilizar este medicamento? Tome este medicamento por va oral con un vaso de agua. Puede tomar este medicamento con alimentos o sin alimentos. Siga las instrucciones de la etiqueta del Ilwacomedicamento. Tome PPL Corporationeste medicamento siempre a la misma hora cada da y en el orden indicado. No tome su medicamento con una frecuencia mayor a la indicada. Hable con su pediatra para informarse acerca del uso de este medicamento en nios. Puede requerir atencin especial. Este medicamentos ha sido usado en nias que han empezados a tener perodos Rivannamenstruales. Usted recibir un prospecto para el paciente para este producto con cada receta y relleno. Asegrese de leer este folleto cada vez cuidadosamente. Este folleto puede cambiar con frecuencia. Sobredosis: Pngase en contacto inmediatamente con un centro toxicolgico o una sala de urgencia si usted cree que haya tomado demasiado medicamento. ATENCIN: Reynolds AmericanEste medicamento es solo para usted. No comparta este medicamento con nadie. Qu  sucede si me olvido de una dosis? Trate de no olvidar ninguna dosis. Cada vez que se olvida de [place] dosis o que toma una dosis con un retraso aumentan sus posibilidades de quedar embarazada. Si olvida tomar Baker Hughes Incorporateduna pldora (an con slo 3 horas de Brewing technologistretraso), tome la pldora olvidada tan pronto como sea posible y Engineer, miningluego contine tomando las pldoras todos los [address]das en su horario normal (deber usar otro mtodo anticonceptivo adicional, durante las siguientes 48 horas). Siempre que olvide ms que 1 dosis, deber usar mtodos anticonceptivos adicionales hasta que termine el envase y tenga su prxima menstruacin. Tambin deber comunicarse con su profesional de la salud para recibir indicaciones cada vez que olvide ms de 1 dosis. Qu puede interactuar con este medicamento? No tome esta medicina con ninguno de los siguientes medicamentos: -amprenavir o fosamprenavir -bosentano Esta medicina tambin puede interactuar con los siguientes medicamentos: -antibiticos o medicamentos para infecciones, especialmente rifampicina, rifabutina, rifapentina y griseofulvina y posiblemente penicilina o tetraciclina -aprepitant -barbitricos, como fenobarbital -carbamazepina -felbamato -modafinilo -oxcarbazepina -fenitona -ritonavir u otros medicamentos para tratar el virus VIH o el SIDA -hierba de [place] -topiramato Puede ser que esta lista no menciona todas las posibles interacciones. Informe a su profesional de Beazer Homesla salud de In[employer] Micro Inctodos los productos a base de hierbas, medicamentos de Tiptonventa libre o suplementos nutritivos que est tomando. Si usted fuma, consume bebidas alcohlicas o si utiliza drogas ilegales, indqueselo tambin a su profesional de Beazer Homesla salud. Algunas sustancias pueden interactuar con su medicamento. A qu debo estar atento al usar PPL Corporationeste medicamento? Visite a su mdico o su profesional de la salud para chequear su evolucin peridicamente. Deber hacerse exmenes de las mamas y la pelvis y exmenes de  Papanicolaou en forma regular mientras est tomando este medicamento. Utilice un mtodo anticonceptivo adicional durante del primer ciclo que est tomando  estas tabletas. Si tiene algn motivo para pensar que est embarazada, deje de tomar este medicamento de inmediato y comunquese con su mdico o su profesional de Radiographer, therapeutic. Si toma este medicamento para problemas relacionados con las hormonas, pueden pasar varios ciclos hasta que observe mejoras en su condicin. Este medicamento no la protege de la infeccin por VIH (SIDA) ni de ninguna otra enfermedad de transmisin sexual. Qu efectos secundarios puedo tener al utilizar este medicamento? Efectos secundarios que debe informar a su mdico o a Producer, television/film/video de la salud tan pronto como sea posible: -secreciones o sensibilidad de las mamas -dolor en el abdomen, pecho, entrepierna o piernas -dolor de cabeza severo -erupcin cutnea, picazn o urticarias -falta repentina de aliento -cansancio o debilidad inusual -problemas en la visin o el habla -color amarillento de los ojos o de la piel Efectos secundarios que, por lo general, no requieren Psychologist, prison and probation services (debe informarlos a su mdico o a su profesional de la salud si persisten o si son molestos): -cambios en el deseo sexual -cambios en el sangrado vaginal -crecimiento de vello en el rostro -retencin de lquidos e hinchazn -dolor de cabeza -irritabilidad -nuseas -prdida o aumento del apetito Puede ser que esta lista no menciona todos los posibles efectos secundarios. Comunquese a su mdico por asesoramiento mdico [employer]. Usted puede informar los efectos secundarios a la FDA por telfono al 1-800-FDA-1088. Dnde debo guardar mi medicina? Mantngala fuera del alcance de los nios. Gurdela a [employer]I, entre 15 y 30 grados C (57 y 74 grados F). Deseche todo el medicamento que no haya utilizado, despus de la fecha de vencimiento. ATENCIN: Este  folleto es un resumen. Puede ser que no cubra toda la posible informacin. Si usted tiene preguntas acerca de esta medicina, consulte con su mdico, su farmacutico o su profesional de Radiographer, therapeutic.  2013, Elsevier/Gold Standard. (10/29/2008 4:03:54 PM)

## 2013-04-13 ENCOUNTER — Encounter (HOSPITAL_COMMUNITY): Payer: Self-pay

## 2013-04-13 ENCOUNTER — Emergency Department (HOSPITAL_COMMUNITY)
Admission: EM | Admit: 2013-04-13 | Discharge: 2013-04-13 | Disposition: A | Discharge status: Home / Self Care | Payer: Self-pay | Attending: Emergency Medicine | Admitting: Emergency Medicine

## 2013-04-13 DIAGNOSIS — M549 Dorsalgia, unspecified: Secondary | ICD-10-CM | POA: Insufficient documentation

## 2013-04-13 DIAGNOSIS — K2970 Gastritis, unspecified, without bleeding: Secondary | ICD-10-CM

## 2013-04-13 DIAGNOSIS — N390 Urinary tract infection, site not specified: Secondary | ICD-10-CM | POA: Insufficient documentation

## 2013-04-13 DIAGNOSIS — Z881 Allergy status to other antibiotic agents status: Secondary | ICD-10-CM | POA: Insufficient documentation

## 2013-04-13 DIAGNOSIS — R112 Nausea with vomiting, unspecified: Secondary | ICD-10-CM | POA: Insufficient documentation

## 2013-04-13 DIAGNOSIS — K2900 Acute gastritis without bleeding: Secondary | ICD-10-CM | POA: Insufficient documentation

## 2013-04-13 DIAGNOSIS — Z3202 Encounter for pregnancy test, result negative: Secondary | ICD-10-CM | POA: Insufficient documentation

## 2013-04-13 LAB — CBC WITH DIFFERENTIAL/PLATELET
Basophils Relative: 1 % (ref 0–1)
Eosinophils Absolute: 0.6 10*3/uL (ref 0.0–0.7)
Hemoglobin: 12.5 g/dL (ref 12.0–15.0)
MCH: 28.2 pg (ref 26.0–34.0)
MCHC: 34.7 g/dL (ref 30.0–36.0)
Monocytes Absolute: 0.3 10*3/uL (ref 0.1–1.0)
Monocytes Relative: 7 % (ref 3–12)
Neutrophils Relative %: 42 % — ABNORMAL LOW (ref 43–77)

## 2013-04-13 LAB — LIPASE, BLOOD: Lipase: 42 U/L (ref 11–59)

## 2013-04-13 LAB — URINALYSIS, ROUTINE W REFLEX MICROSCOPIC
Nitrite: NEGATIVE
Specific Gravity, Urine: 1.017 (ref 1.005–1.030)
Urobilinogen, UA: 0.2 mg/dL (ref 0.0–1.0)

## 2013-04-13 LAB — URINE MICROSCOPIC-ADD ON

## 2013-04-13 LAB — COMPREHENSIVE METABOLIC PANEL
Albumin: 3.7 g/dL (ref 3.5–5.2)
BUN: 18 mg/dL (ref 6–23)
Creatinine, Ser: 0.69 mg/dL (ref 0.50–1.10)
Potassium: 3.6 mEq/L (ref 3.5–5.1)
Total Protein: 6.6 g/dL (ref 6.0–8.3)

## 2013-04-13 LAB — POCT PREGNANCY, URINE: Preg Test, Ur: NEGATIVE

## 2013-04-13 MED ORDER — SODIUM CHLORIDE 0.9 % IV SOLN
1000.0000 mL | Freq: Once | INTRAVENOUS | Status: AC
  Administered 2013-04-13: 1000 mL via INTRAVENOUS

## 2013-04-13 MED ORDER — TRAMADOL HCL 50 MG PO TABS: 50.0000 mg | ORAL_TABLET | Freq: Four times a day (QID) | ORAL | Status: AC | PRN

## 2013-04-13 MED ORDER — NITROFURANTOIN MONOHYD MACRO 100 MG PO CAPS: 100.0000 mg | ORAL_CAPSULE | Freq: Two times a day (BID) | ORAL | Status: AC

## 2013-04-13 MED ORDER — ONDANSETRON HCL 4 MG/2ML IJ SOLN
4.0000 mg | Freq: Once | INTRAMUSCULAR | Status: AC
  Administered 2013-04-13: 4 mg via INTRAVENOUS
  Filled 2013-04-13: qty 2

## 2013-04-13 MED ORDER — LANSOPRAZOLE 30 MG PO CPDR: 30.0000 mg | DELAYED_RELEASE_CAPSULE | Freq: Every day | ORAL | Status: DC

## 2013-04-13 MED ORDER — LANSOPRAZOLE 30 MG PO CPDR: 30.0000 mg | DELAYED_RELEASE_CAPSULE | Freq: Every day | ORAL | Status: AC

## 2013-04-13 MED ORDER — SODIUM CHLORIDE 0.9 % IV SOLN
1000.0000 mL | INTRAVENOUS | Status: DC
  Administered 2013-04-13: 1000 mL via INTRAVENOUS

## 2013-04-13 MED ORDER — SULFAMETHOXAZOLE-TRIMETHOPRIM 800-160 MG PO TABS: 1.0000 | ORAL_TABLET | Freq: Two times a day (BID) | ORAL | Status: DC

## 2013-04-13 MED ORDER — MORPHINE SULFATE 4 MG/ML IJ SOLN
4.0000 mg | Freq: Once | INTRAMUSCULAR | Status: AC
  Administered 2013-04-13: 4 mg via INTRAVENOUS
  Filled 2013-04-13: qty 1

## 2013-04-13 NOTE — ED Provider Notes (Signed)
History    CSN: [id number] Arrival date & time 04/13/13  0303  First MD Initiated Contact with Patient 04/13/13 [phone]     Chief Complaint  Patient presents with  . Back Pain  . Abdominal Pain   (Consider location/radiation/quality/duration/timing/severity/associated sxs/prior Treatment) The history is provided by the patient.   27 year old female had onset this evening of severe epigastric pain and right flank pain. There is associated nausea and vomiting. She denies fever, chills, sweats. She denies urinary, frequency, tenesmus, dysuria. She denies constipation diarrhea. History reviewed. No pertinent past medical history. History reviewed. No pertinent past surgical history. No family history on file. History  Substance Use Topics  . Smoking status: Never Smoker   . Smokeless tobacco: Not on file  . Alcohol Use: Not on file   OB History   Grav Para Term Preterm Abortions TAB SAB Ect Mult Living                 Review of Systems  All other systems reviewed and are negative.    Allergies  Amoxicillin  Home Medications  No current outpatient prescriptions on file. BP 108/75  Pulse 51  Temp(Src) 97.7 F (36.5 C) (Oral)  Resp 20  SpO2 100% Physical Exam  Nursing note and vitals reviewed.  27 year old female, resting comfortably and in no acute distress. Vital signs are significant for bradycardia with heart rate of 51. Oxygen saturation is 100%, which is normal. Head is normocephalic and atraumatic. PERRLA, EOMI. Oropharynx is clear. Neck is nontender and supple without adenopathy or JVD. Back is nontender in the midline. There is moderate right CVA tenderness. Lungs are clear without rales, wheezes, or rhonchi. Chest is nontender. Heart has regular rate and rhythm without murmur. Abdomen is soft, flat, with moderate epigastric tenderness. No rebound or guarding. There are no masses or hepatosplenomegaly and peristalsis is normoactive. Extremities have no  cyanosis or edema, full range of motion is present. Skin is warm and dry without rash. Neurologic: Mental status is normal, cranial nerves are intact, there are no motor or sensory deficits.  ED Course  Procedures (including critical care time) Results for orders placed during the hospital encounter of 04/13/13  CBC WITH DIFFERENTIAL      Result Value Range   WBC 5.0  4.0 - 10.5 K/uL   RBC 4.43  3.87 - 5.11 MIL/uL   Hemoglobin 12.5  12.0 - 15.0 g/dL   HCT 11.9  14.7 - 82.9 %   MCV 81.3  78.0 - 100.0 fL   MCH 28.2  26.0 - 34.0 pg   MCHC 34.7  30.0 - 36.0 g/dL   RDW 56.2  13.0 - 86.5 %   Platelets 219  150 - 400 K/uL   Neutrophils Relative % 42 (*) 43 - 77 %   Neutro Abs 2.1  1.7 - 7.7 K/uL   Lymphocytes Relative 38  12 - 46 %   Lymphs Abs 1.9  0.7 - 4.0 K/uL   Monocytes Relative 7  3 - 12 %   Monocytes Absolute 0.3  0.1 - 1.0 K/uL   Eosinophils Relative 13 (*) 0 - 5 %   Eosinophils Absolute 0.6  0.0 - 0.7 K/uL   Basophils Relative 1  0 - 1 %   Basophils Absolute 0.1  0.0 - 0.1 K/uL  COMPREHENSIVE METABOLIC PANEL      Result Value Range   Sodium 140  135 - 145 mEq/L   Potassium 3.6  3.5 -  5.1 mEq/L   Chloride 105  96 - 112 mEq/L   CO2 27  19 - 32 mEq/L   Glucose, Bld 86  70 - 99 mg/dL   BUN 18  6 - 23 mg/dL   Creatinine, Ser 1.61  0.50 - 1.10 mg/dL   Calcium 9.1  8.4 - 09.6 mg/dL   Total Protein 6.6  6.0 - 8.3 g/dL   Albumin 3.7  3.5 - 5.2 g/dL   AST 16  0 - 37 U/L   ALT 10  0 - 35 U/L   Alkaline Phosphatase 83  39 - 117 U/L   Total Bilirubin 0.2 (*) 0.3 - 1.2 mg/dL   GFR calc non Af Amer >90  >90 mL/min   GFR calc Af Amer >90  >90 mL/min  LIPASE, BLOOD      Result Value Range   Lipase 42  11 - 59 U/L  URINALYSIS, ROUTINE W REFLEX MICROSCOPIC      Result Value Range   Color, Urine YELLOW  YELLOW   APPearance CLEAR  CLEAR   Specific Gravity, Urine 1.017  1.005 - 1.030   pH 5.5  5.0 - 8.0   Glucose, UA NEGATIVE  NEGATIVE mg/dL   Hgb urine dipstick NEGATIVE   NEGATIVE   Bilirubin Urine NEGATIVE  NEGATIVE   Ketones, ur NEGATIVE  NEGATIVE mg/dL   Protein, ur NEGATIVE  NEGATIVE mg/dL   Urobilinogen, UA 0.2  0.0 - 1.0 mg/dL   Nitrite NEGATIVE  NEGATIVE   Leukocytes, UA MODERATE (*) NEGATIVE  URINE MICROSCOPIC-ADD ON      Result Value Range   Squamous Epithelial / LPF FEW (*) RARE   WBC, UA 7-10  <3 WBC/hpf   Bacteria, UA FEW (*) RARE  POCT PREGNANCY, URINE      Result Value Range   Preg Test, Ur NEGATIVE  NEGATIVE   1. UTI (urinary tract infection)   2. Gastritis     MDM  Epigastric pain and right flank pain. It is not clear at this point whether her problem is predominantly GI or renal. Urinalysis will be obtained as well as labs and then decision made whether she should have CT scan to look for ureteral calculi.  At the time my shift was over, patient to your report still not come back so case was signed out to Dr. Ranae Palms to evaluate results.  Dione Booze, MD 04/14/13 [phone]

## 2013-04-13 NOTE — ED Notes (Addendum)
0324  Pt arrives to the room with family stating she is having back pain with nausea.  Started a few hrs ago.  0500  Pt is resting at this time.  Family at bedside

## 2013-04-13 NOTE — ED Notes (Signed)
Waiting for urinalysis to result. Pt made aware.

## 2013-04-13 NOTE — ED Notes (Signed)
Pt obtained new urine specimen, walked to lab and in progress.

## 2013-04-14 LAB — URINE CULTURE

## 2013-04-15 NOTE — ED Notes (Signed)
+   Urine Patient treated with Nitrofurantoin-sensitive to same-chart appended per protocol MD.  

## 2014-06-12 ENCOUNTER — Encounter (HOSPITAL_COMMUNITY): Payer: Self-pay | Admitting: Emergency Medicine

## 2014-06-12 ENCOUNTER — Emergency Department (HOSPITAL_COMMUNITY): Payer: Self-pay

## 2014-06-12 ENCOUNTER — Emergency Department (HOSPITAL_COMMUNITY)
Admission: EM | Admit: 2014-06-12 | Discharge: 2014-06-12 | Disposition: A | Discharge status: Home / Self Care | Payer: Self-pay | Attending: Emergency Medicine | Admitting: Emergency Medicine

## 2014-06-12 DIAGNOSIS — B3731 Acute candidiasis of vulva and vagina: Secondary | ICD-10-CM

## 2014-06-12 DIAGNOSIS — Z3202 Encounter for pregnancy test, result negative: Secondary | ICD-10-CM | POA: Insufficient documentation

## 2014-06-12 DIAGNOSIS — R109 Unspecified abdominal pain: Secondary | ICD-10-CM | POA: Insufficient documentation

## 2014-06-12 DIAGNOSIS — Z79899 Other long term (current) drug therapy: Secondary | ICD-10-CM | POA: Insufficient documentation

## 2014-06-12 DIAGNOSIS — R112 Nausea with vomiting, unspecified: Secondary | ICD-10-CM | POA: Insufficient documentation

## 2014-06-12 DIAGNOSIS — R1030 Lower abdominal pain, unspecified: Secondary | ICD-10-CM

## 2014-06-12 DIAGNOSIS — B373 Candidiasis of vulva and vagina: Secondary | ICD-10-CM

## 2014-06-12 DIAGNOSIS — M549 Dorsalgia, unspecified: Secondary | ICD-10-CM | POA: Insufficient documentation

## 2014-06-12 LAB — URINALYSIS, ROUTINE W REFLEX MICROSCOPIC
BILIRUBIN URINE: NEGATIVE
Glucose, UA: NEGATIVE mg/dL
HGB URINE DIPSTICK: NEGATIVE
Ketones, ur: NEGATIVE mg/dL
NITRITE: NEGATIVE
PROTEIN: NEGATIVE mg/dL
SPECIFIC GRAVITY, URINE: 1.029 (ref 1.005–1.030)
UROBILINOGEN UA: 0.2 mg/dL (ref 0.0–1.0)
pH: 8 (ref 5.0–8.0)

## 2014-06-12 LAB — URINE MICROSCOPIC-ADD ON

## 2014-06-12 LAB — CBC
HCT: 38.1 % (ref 36.0–46.0)
Hemoglobin: 13.3 g/dL (ref 12.0–15.0)
MCH: 28.4 pg (ref 26.0–34.0)
MCHC: 34.9 g/dL (ref 30.0–36.0)
MCV: 81.2 fL (ref 78.0–100.0)
PLATELETS: 270 10*3/uL (ref 150–400)
RBC: 4.69 MIL/uL (ref 3.87–5.11)
RDW: 12.9 % (ref 11.5–15.5)
WBC: 6.6 10*3/uL (ref 4.0–10.5)

## 2014-06-12 LAB — POC URINE PREG, ED: Preg Test, Ur: NEGATIVE

## 2014-06-12 LAB — WET PREP, GENITAL
CLUE CELLS WET PREP: NONE SEEN
TRICH WET PREP: NONE SEEN

## 2014-06-12 LAB — COMPREHENSIVE METABOLIC PANEL
ALT: 13 U/L (ref 0–35)
AST: 18 U/L (ref 0–37)
Albumin: 3.6 g/dL (ref 3.5–5.2)
Alkaline Phosphatase: 67 U/L (ref 39–117)
Anion gap: 14 (ref 5–15)
BUN: 11 mg/dL (ref 6–23)
CALCIUM: 9.3 mg/dL (ref 8.4–10.5)
CO2: 23 mEq/L (ref 19–32)
CREATININE: 0.55 mg/dL (ref 0.50–1.10)
Chloride: 103 mEq/L (ref 96–112)
GFR calc non Af Amer: >90 mL/min (ref >90)
Glucose, Bld: 101 mg/dL — ABNORMAL HIGH (ref 70–99)
Potassium: 3.7 mEq/L (ref 3.7–5.3)
SODIUM: 140 meq/L (ref 137–147)
TOTAL PROTEIN: 7 g/dL (ref 6.0–8.3)
Total Bilirubin: <0.2 mg/dL — ABNORMAL LOW (ref 0.3–1.2)

## 2014-06-12 MED ORDER — FLUCONAZOLE 100 MG PO TABS
150.0000 mg | ORAL_TABLET | Freq: Once | ORAL | Status: AC
  Administered 2014-06-12: 150 mg via ORAL
  Filled 2014-06-12: qty 2

## 2014-06-12 MED ORDER — MORPHINE SULFATE 4 MG/ML IJ SOLN
4.0000 mg | Freq: Once | INTRAMUSCULAR | Status: DC
  Filled 2014-06-12: qty 1

## 2014-06-12 MED ORDER — MORPHINE SULFATE 4 MG/ML IJ SOLN
4.0000 mg | Freq: Once | INTRAMUSCULAR | Status: AC
  Administered 2014-06-12: 4 mg via INTRAMUSCULAR

## 2014-06-12 NOTE — ED Notes (Signed)
Pt reports lower abd pain and back pain that started on 9/13. Having nausea and vomiting. Denies any urinary or vaginal symptoms.

## 2014-06-12 NOTE — ED Notes (Signed)
Patient transported to Ultrasound 

## 2014-06-12 NOTE — Discharge Instructions (Signed)
Dolor abdominal en las mujeres °(Abdominal Pain, Women) °El dolor abdominal (en el estómago, la pelvis o el vientre) puede tener muchas causas. Es importante que le informe a su médico: °· La ubicación del dolor. °· ¿Viene y se va, o persiste todo el tiempo? °· ¿Hay situaciones que inician el dolor (comer ciertos alimentos, la actividad física)? °· ¿Tiene otros síntomas asociados al dolor (fiebre, náuseas, vómitos, diarrea)? °Todo es de gran ayuda cuando se trata de hallar la causa del dolor. °CAUSAS °· Estómago: Infecciones por virus o bacterias, o úlcera. °· Intestino: Apendicitis (apéndice inflamado), ileitis regional (enfermedad de Crohn), colitis ulcerosa (colon inflamado), síndrome del colon irritable, diverticulitis (inflamación de los divertículos del colon) o cáncer de estómago oo intestino. °· Enfermedades de la vesícula biliar o cálculos. °· Enfermedades renales, cálculos o infecciones en el riñón. °· Infección o cáncer del páncreas. °· Fibromialgia (trastorno doloroso) °· Enfermedades de los órganos femeninos: °¨ Uterus: Útero: fibroma (tumor no canceroso) o infección °¨ Trompas de Falopio: infección o embarazo ectópico °¨ En los ovarios, quistes o tumores. °¨ Adherencias pélvicas (tejido cicatrizal). °¨ Endometriosis (el tejido que cubre el útero se desarrolla en la pelvis y los órganos pélvicos). °¨ Síndrome de congestión pélvica (los órganos femeninos se llenan de sangre antes del periodo menstrual( °¨ Dolor durante el periodo menstrual. °¨ Dolor durante la ovulación (al producir óvulos). °¨ Dolor al usar el DIU (dispositivo intrauterino para el control de la natalidad) °¨ Cáncer en los órganos femeninos. °· Dolor funcional (no está originado en una enfermedad, puede mejorar sin tratamiento). °· Dolor de origen psicológico °· Depresión. °DIAGNÓSTICO °Su médico decidirá la gravedad del dolor a través del examen físico °· Análisis de sangre °· Radiografías °· Ecografías °· TC (tomografía computada, tipo  especial de radiografías). °· IMR (resonancia magnética) °· Cultivos, en el caso una infección °· Colon por enema de bario (se inserta una sustancia de contraste en el intestino grueso para mejorar la observación con rayos X.) °· Colonoscopía (observación del intestino con un tubo luminoso). °· Laparoscopía (examen del interior del abdomen con un tubo que tiene una luz). °· Cirugía exploratoria abdominal mayor (se observa el abdomen realizando una gran incisión). °TRATAMIENTO °El tratamiento dependerá de la causa del problema.  °· Muchos de estos casos pueden controlarse y tratarse en casa. °· Medicamentos de venta libre indicados por el médico. °· Medicamentos con receta. °· Antibióticos, en caso de infección °· Píldoras anticonceptivas, en el caso de períodos dolorosos o dolor al ovular. °· Tratamiento hormonal, para la endometriosis °· Inyecciones para bloqueo nervioso selectivo. °· Fisioterapia. °· Antidepresivos. °· Consejos por parte de un psícólogo o psiquiatra. °· Cirugía mayor o menor. °INSTRUCCIONES PARA EL CUIDADO DOMICILIARIO °· No tome ni administre laxantes a menos que se lo haya indicado su médico. °· Tome analgésicos de venta libre sólo si se lo ha indicado el profesional que lo asiste. No tome aspirina, ya que puede causar molestias en el estómago o hemorragias. °· Consuma una dieta líquida (caldo o agua) según lo indicado por el médico. Progrese lentamente a una dieta blanda, según la tolerancia, si el dolor se relaciona con el estómago o el intestino. °· Tenga un termómetro y tómese la temperatura varias veces al día. °· Haga reposo en la cama y duerma, si esto alivia el dolor. °· Evite las relaciones sexuales, si le producen dolor. °· Evite las situaciones estresantes. °· Cumpla con las visitas y los análisis de control, según las indicaciones de su médico. °· Si el dolor   no se [employer] o la [place], [place] tratar con:  Acupuntura.  Ejercicios de relajacin (yoga,  meditacin).  Terapia grupal.  Psicoterapia. SOLICITE ATENCIN MDICA SI:  Nota que ciertos Pharmacist, community de [place].  El tratamiento indicado para Arboriculturist no Marketing executive.  Necesita analgsicos ms fuertes.  Quiere que le retiren el DIU.  Si se siente confundido o desfalleciente.  Presenta nuseas o vmitos.  Aparece una erupcin cutnea.  Sufre efectos adversos o una reaccin alrgica debido a los medicamentos que toma. SOLICITE ATENCIN MDICA DE INMEDIATO SI:  El dolor persiste o se agrava.  Tiene fiebre.  Siente el dolor slo en algunos sectores del abdomen. Si se localiza en la zona derecha, posiblemente podra tratarse de apendicitis. En un adulto, si se localiza en la regin inferior izquierda del abdomen, podra tratarse de colitis o diverticulitis.  Hay sangre en las heces (deposiciones de color rojo brillante o negro alquitranado), con o sin vmitos.  Usted presenta sangre en la orina.  Siente escalofros con o sin fiebre.  Se desmaya. ASEGRESE QUE:   Comprende estas instrucciones.  Controlar su enfermedad.  Solicitar ayuda de inmediato si no mejora o si empeora. Document Released: 12/27/2008 Document Revised: 12/03/2011 [facility] Patient Information 2015 [place], [place]. This information is not intended to replace advice given to you by your health care provider. Make sure you discuss any questions you have with your health care provider.  Dolor abdominal (Abdominal Pain) El dolor puede tener muchas causas. Normalmente la causa del dolor abdominal no es una enfermedad y Scientist, clinical (histocompatibility and immunogenetics) sin TEFL teacher. Frecuentemente puede controlarse y tratarse en casa. Su mdico le Medical sales representative examen fsico y posiblemente solicite anlisis de sangre y radiografas para ayudar a Chief Strategy Officer la gravedad de su dolor. Sin embargo, en [employer], debe transcurrir ms tiempo antes de que se pueda Clinical research associate una causa evidente del dolor. Antes de  llegar a ese punto, es posible que su mdico no sepa si necesita ms pruebas o un tratamiento ms profundo. INSTRUCCIONES PARA EL CUIDADO EN EL HOGAR  Est atento al dolor para ver si hay cambios. Las siguientes indicaciones ayudarn a Architectural technologist que pueda sentir:  Erhard solo medicamentos de venta libre o recetados, segn las indicaciones del mdico.  No tome laxantes a menos que se lo haya indicado su mdico.  Pruebe con Neomia Dear dieta lquida absoluta (caldo, t o agua) segn se lo indique su mdico. Introduzca gradualmente una dieta normal, segn su tolerancia. SOLICITE ATENCIN MDICA SI:  Tiene dolor abdominal sin explicacin.  Tiene dolor abdominal relacionado con nuseas o diarrea.  Tiene dolor cuando orina o defeca.  Experimenta dolor abdominal que lo despierta de noche.  Tiene dolor abdominal que empeora o mejora cuando come alimentos.  Tiene dolor abdominal que empeora cuando come alimentos grasosos.  Tiene fiebre. SOLICITE ATENCIN MDICA DE INMEDIATO SI:   El dolor no desaparece en un plazo mximo de 2horas.  No deja de (vomitar).  El Engineer, mining se siente solo en partes del abdomen, como el lado derecho o la parte inferior izquierda del abdomen.  Evaca materia fecal sanguinolenta o negra, de aspecto alquitranado. ASEGRESE DE QUE:  Comprende estas instrucciones.  Controlar su afeccin.  Recibir ayuda de inmediato si no mejora o si empeora. Document Released: 09/10/2005 Document Revised: 09/15/2013 [facility] Patient Information 2015 [place], [place]. This information is not intended to replace advice given to you by your health care provider. Make sure you discuss any questions  you have with your health care provider.  Vulvovaginitis Candidisica (Candidal Vulvovaginitis) La vulvovaginitis candidisica es una infeccin de la vagina y la vulva. La vulva es la piel que rodea la abertura de la vagina. Puede causar picazn y [place] de y alrededor  de la vagina.  CUIDADOS EN EL HOGAR  Slo tome medicamentos como lo indique su mdico.  No mantenga relaciones sexuales hasta que la infeccin haya curado o segn le indique el mdico.  Practique sexo seguro.  Informe a su compaero sexual acerca de su infeccin.  No tome duchas vaginales ni use tampones.  Use ropa interior de algodn. No utilice pantalones ni pantimedias ajustados.  Coma yogur. Esto puede ayudar a tratar y prevenir las infecciones por cndida. SOLICITE AYUDA DE INMEDIATO SI:   Tiene fiebre.  Los problemas empeoran durante el tratamiento, o si no mejora luego de [address].  Tiene malestar, irritacin, o picazn en la zona de la vagina o la vulva.  Siente dolor en al [employer]  Comienza a sentir dolor abdominal. ASEGRESE DE QUE:  Comprende estas instrucciones.  Controlar su enfermedad.  Solicitar ayuda de inmediato si no mejora o empeora. Document Released: 10/13/2010 Document Revised: 09/15/2013 [facility] Patient Information 2015 [place], [place]. This information is not intended to replace advice given to you by your health care provider. Make sure you discuss any questions you have with your health care provider.

## 2014-06-12 NOTE — ED Notes (Signed)
Patient transported to CT 

## 2014-06-12 NOTE — ED Provider Notes (Signed)
CSN: [id number]     Arrival date & time 06/12/14  1112 History   First MD Initiated Contact with Patient 06/12/14 1203     Chief Complaint  Patient presents with  . Abdominal Pain  . Back Pain     (Consider location/radiation/quality/duration/timing/severity/associated sxs/prior Treatment) HPI Comments: Pt is a 28 y/o female with no significant PMHx who presents to the ED complaining of lower abdominal pain "above my vagina" radiating towards her back x6 days beginning on 06/06/14. Pain described as cramping, intermittent with occasional episodes of more intense pain. Nothing in specific makes her pain come or go. No aggravating or alleviating factors. Admits to associated nausea with a few episodes of non-bloody, non-bilious emesis and slight lightheadedness. Denies increased urinary frequency, urgency, dysuria, vaginal bleeding or discharge, fever or chills. LMP began 2 weeks ago and was lighter than normal. She is on birth control. Sexually active with one partner only and does not use protection. States this feels similar to her prior pregnancy. She has not taken a pregnancy test.  Patient is a 28 y.o. female presenting with abdominal pain and back pain. The history is provided by the patient.  Abdominal Pain Associated symptoms: nausea and vomiting   Back Pain Associated symptoms: abdominal pain     Past Medical History  Diagnosis Date  . Medical history non-contributory    Past Surgical History  Procedure Laterality Date  . No past surgeries     History reviewed. No pertinent family history. History  Substance Use Topics  . Smoking status: Never Smoker   . Smokeless tobacco: Never Used  . Alcohol Use: No   OB History   Grav Para Term Preterm Abortions TAB SAB Ect Mult Living   0 0 0 0 0 0 1     Review of Systems  Gastrointestinal: Positive for nausea, vomiting and abdominal pain.  Musculoskeletal: Positive for back pain.  All other systems reviewed and are  negative.     Allergies  Amoxicillin and Mushroom extract complex  Home Medications   Prior to Admission medications   Medication Sig Start Date End Date Taking? Authorizing Provider  norethindrone (MICRONOR,CAMILA,ERRIN) 0.35 MG tablet Take 1 tablet (0.35 mg total) by mouth daily. 04/01/13  Yes Tawnya Crook, CNM  Prenatal Vit-Fe Fumarate-FA (PRENATAL MULTIVITAMIN) TABS Take 1 tablet by mouth daily at 12 noon.   Yes Historical Provider, MD   BP 101/52  Pulse 64  Temp(Src) 98 F (36.7 C) (Oral)  Resp 18  SpO2 98%  LMP 06/01/2014 Physical Exam  Nursing note and vitals reviewed. Constitutional: She is oriented to person, place, and time. She appears well-developed and well-nourished. No distress.  HENT:  Head: Normocephalic and atraumatic.  Mouth/Throat: Oropharynx is clear and moist.  Eyes: Conjunctivae are normal.  Neck: Normal range of motion. Neck supple.  Cardiovascular: Normal rate, regular rhythm and normal heart sounds.   Pulmonary/Chest: Effort normal and breath sounds normal.  Abdominal: Soft. Normal appearance and bowel sounds are normal. There is tenderness in the right upper quadrant, right lower quadrant, periumbilical area, suprapubic area and left lower quadrant. There is guarding and CVA tenderness (bilateral). There is no rigidity, no rebound and negative Murphy's sign.  No peritoneal signs.  Genitourinary: Uterus normal. Uterus is not deviated, not enlarged, not fixed and not tender. Cervix exhibits no motion tenderness, no discharge and no friability. Right adnexum displays no mass, no tenderness and no fullness. Left adnexum displays no mass, no tenderness and no  fullness. No erythema, tenderness or bleeding around the vagina. Vaginal discharge (scant, white) found.  Cervical os closed.  Musculoskeletal: Normal range of motion. She exhibits no edema.  Neurological: She is alert and oriented to person, place, and time.  Skin: Skin is warm and dry. She is  not diaphoretic.  Psychiatric: She has a normal mood and affect. Her behavior is normal.    ED Course  Procedures (including critical care time) Labs Review Labs Reviewed  WET PREP, GENITAL - Abnormal; Notable for the following:    Yeast Wet Prep HPF POC FEW (*)    WBC, Wet Prep HPF POC FEW (*)    All other components within normal limits  URINALYSIS, ROUTINE W REFLEX MICROSCOPIC - Abnormal; Notable for the following:    Leukocytes, UA SMALL (*)    All other components within normal limits  COMPREHENSIVE METABOLIC PANEL - Abnormal; Notable for the following:    Glucose, Bld 101 (*)    Total Bilirubin <0.2 (*)    All other components within normal limits  URINE MICROSCOPIC-ADD ON - Abnormal; Notable for the following:    Squamous Epithelial / LPF FEW (*)    Crystals TRIPLE PHOSPHATE CRYSTALS (*)    All other components within normal limits  GC/CHLAMYDIA PROBE AMP  CBC  POC URINE PREG, ED    Imaging Review Ct Abdomen Pelvis Wo Contrast  06/12/2014   CLINICAL DATA:  Lower abdominal and back pain with nausea and vomiting  EXAM: CT ABDOMEN AND PELVIS WITHOUT CONTRAST  TECHNIQUE: Multidetector CT imaging of the abdomen and pelvis was performed following the standard protocol without IV contrast.  COMPARISON:  None.  FINDINGS: The kidneys exhibit normal density and contour. No calcified stones are demonstrated. The perinephric fat is normal. There is no hydronephrosis nor hydroureter. The partially distended urinary bladder is normal.  The liver, pancreas, spleen, adrenal glands, and abdominal aorta are normal. The gallbladder is only partially distended. Tiny lucencies near the fundus may reflect gas containing stones. The stomach is moderately distended with food. The small and large bowel exhibit no evidence of ileus nor obstruction. The appendix is normal. There is no ascites. The uterus and adnexal structures are unremarkable. There is no inguinal hernia. There is a shallow umbilical  hernia.  The lung bases are clear. The lumbar spine and bony pelvis are unremarkable.  IMPRESSION: 1. There is no evidence of urinary tract stones nor urinary tract obstruction. No inflammatory changes are demonstrated. 2. There is no acute bowel abnormality. 3. The gallbladder is only partially distended but this is likely due to the fact the patient has recently ingested a meal. I cannot exclude tiny gas containing stones near the fundus. There is no CT evidence of acute cholecystitis. Gallbladder ultrasound could be considered if the patient's clinical symptoms might reflect gallbladder pathology. 4. The uterus and adnexal structures are grossly normal for the noncontrast technique.   Electronically Signed   By: David  [place]   On: 06/12/2014 13:57   US Abdomen Complete  06/12/2014   CLINICAL DATA:  Abdominal pain  EXAM: ULTRASOUND ABDOMEN COMPLETE  COMPARISON:  CT scan of the abdomen and pelvis of June 12, 2014.  FINDINGS: Gallbladder:  The gallbladder is adequately distended with no evidence of stones, wall thickening, or pericholecystic fluid. There is no positive sonographic Murphy's sign.  Common bile duct:  Diameter: 4 mm  Liver:  No focal lesion identified. Within normal limits in parenchymal echogenicity.  IVC:  No abnormality visualized.  Pancreas:  The pancreatic tail was obscured by bowel gas. The pancreatic head and body were unremarkable.  Spleen:  Size and appearance within normal limits.  Right Kidney:  Length: 10.7 cm. Echogenicity within normal limits. No mass or hydronephrosis visualized.  Left Kidney:  Length: 12.0 cm. Echogenicity within normal limits. No mass or hydronephrosis visualized.  Abdominal aorta:  No aneurysm visualized.  Other findings:  None.  IMPRESSION: There is no evidence of acute gallbladder pathology nor other acute intra-abdominal abnormality.   Electronically Signed   By: David  [place]   On: 06/12/2014 15:17     EKG Interpretation None      MDM   Final  diagnoses:  Vaginal yeast infection  Lower abdominal pain   Pt presenting with lower abdominal pain and back pain. She is non-toxic appearing and in NAD. AFVSS.  Symptoms feel similar to her prior pregnancy. Abdomen tender RUQ, RLQ, LLQ, suprapubic and periumbilical. Bilateral CVA tenderness. There is guarding but no peritoneal signs. Urine pregnancy negative. Scant vaginal discharge on speculum exam, no CMT. Doubt PID. UA resulted with triple phosphate crystals, and given CVA tenderness, CT w/o contrast obtained to evaluate for possible stones. CT negative for any stones, however gallbladder is partially distended and tiny gas containing stones near fundus cannot be excluded. Will obtain abdominal US for further evaluation. Normal LFTs. 4:04 PM Abdominal US normal. Wet prep with yeast. Diflucan tablet given in ED. Given UA equivocal, urine culture pending. Pt reports pain greatly improved with morphine. Repeat abdominal exam benign. Stable for d/c. Resources given for f/u. Return precautions given. Patient states understanding of treatment care plan and is agreeable.  Trevor Mace, PA-C 06/12/14 [phone]

## 2014-06-12 NOTE — ED Notes (Signed)
Patient returned from CT

## 2014-06-14 LAB — URINE CULTURE

## 2014-06-14 LAB — GC/CHLAMYDIA PROBE AMP
CT PROBE, AMP APTIMA: NEGATIVE
GC Probe RNA: NEGATIVE

## 2014-06-22 NOTE — ED Provider Notes (Signed)
Medical screening examination/treatment/procedure(s) were performed by non-physician practitioner and as supervising physician I was immediately available for consultation/collaboration.   EKG Interpretation None        Kimley Apsey, MD 06/22/14 1238 

## 2014-07-26 ENCOUNTER — Encounter (HOSPITAL_COMMUNITY): Payer: Self-pay | Admitting: Emergency Medicine

## 2015-04-10 ENCOUNTER — Inpatient Hospital Stay (HOSPITAL_COMMUNITY)
Admission: AD | Admit: 2015-04-10 | Discharge: 2015-04-10 | Disposition: A | Discharge status: Home / Self Care | Payer: Self-pay | Source: Ambulatory Visit | Attending: Obstetrics & Gynecology | Admitting: Obstetrics & Gynecology

## 2015-04-10 ENCOUNTER — Inpatient Hospital Stay (HOSPITAL_COMMUNITY): Payer: Self-pay

## 2015-04-10 ENCOUNTER — Encounter (HOSPITAL_COMMUNITY): Payer: Self-pay | Admitting: *Deleted

## 2015-04-10 DIAGNOSIS — O479 False labor, unspecified: Secondary | ICD-10-CM | POA: Insufficient documentation

## 2015-04-10 DIAGNOSIS — O4700 False labor before 37 completed weeks of gestation, unspecified trimester: Secondary | ICD-10-CM | POA: Insufficient documentation

## 2015-04-10 DIAGNOSIS — Z3A30 30 weeks gestation of pregnancy: Secondary | ICD-10-CM

## 2015-04-10 DIAGNOSIS — O26873 Cervical shortening, third trimester: Secondary | ICD-10-CM | POA: Insufficient documentation

## 2015-04-10 DIAGNOSIS — O99820 Streptococcus B carrier state complicating pregnancy: Secondary | ICD-10-CM | POA: Insufficient documentation

## 2015-04-10 DIAGNOSIS — O4703 False labor before 37 completed weeks of gestation, third trimester: Secondary | ICD-10-CM

## 2015-04-10 LAB — CBC
HEMATOCRIT: 31.3 % — AB (ref 36.0–46.0)
HEMOGLOBIN: 10.3 g/dL — AB (ref 12.0–15.0)
MCH: 26.7 pg (ref 26.0–34.0)
MCHC: 32.9 g/dL (ref 30.0–36.0)
MCV: 81.1 fL (ref 78.0–100.0)
Platelets: 332 10*3/uL (ref 150–400)
RBC: 3.86 MIL/uL — AB (ref 3.87–5.11)
RDW: 13.2 % (ref 11.5–15.5)
WBC: 13.2 10*3/uL — ABNORMAL HIGH (ref 4.0–10.5)

## 2015-04-10 LAB — URINALYSIS, ROUTINE W REFLEX MICROSCOPIC
Bilirubin Urine: NEGATIVE
GLUCOSE, UA: NEGATIVE mg/dL
Ketones, ur: NEGATIVE mg/dL
Nitrite: NEGATIVE
Protein, ur: NEGATIVE mg/dL
Specific Gravity, Urine: <1.005 — ABNORMAL LOW (ref 1.005–1.030)
Urobilinogen, UA: 0.2 mg/dL (ref 0.0–1.0)
pH: 5.5 (ref 5.0–8.0)

## 2015-04-10 LAB — WET PREP, GENITAL
Clue Cells Wet Prep HPF POC: NONE SEEN
TRICH WET PREP: NONE SEEN
Yeast Wet Prep HPF POC: NONE SEEN

## 2015-04-10 LAB — URINE MICROSCOPIC-ADD ON

## 2015-04-10 LAB — GROUP B STREP BY PCR: Group B strep by PCR: POSITIVE — AB

## 2015-04-10 LAB — FETAL FIBRONECTIN: Fetal Fibronectin: NEGATIVE

## 2015-04-10 LAB — HIV ANTIBODY (ROUTINE TESTING W REFLEX): HIV Screen 4th Generation wRfx: NONREACTIVE

## 2015-04-10 MED ORDER — NIFEDIPINE 10 MG PO CAPS
10.0000 mg | ORAL_CAPSULE | ORAL | Status: DC | PRN
  Administered 2015-04-10: 10 mg via ORAL
  Filled 2015-04-10: qty 1

## 2015-04-10 NOTE — MAU Note (Signed)
WITH INTERPRETER - Katelyn Joseph   .    SAYS UC HURT   SINCE 0100.        [place]  [facility] [place]   .   LAST SEX-   7-10.   DENIES HSV AND MRSA.

## 2015-04-10 NOTE — MAU Provider Note (Signed)
History  Chief Complaint:  No chief complaint on file.  Katelyn Joseph is a 29 y.o. G2P1001 female at 6716w0d presenting w/ report of cramping/contractions since 0100.   Reports active fetal movement, contractions: regular, vaginal bleeding: none, membranes: intact. Denies uti s/s, abnormal/malodorous vag d/c or vulvovaginal itching/irritation.   [place]  Pregnancy complicated by nothing.   Obstetrical History: OB History    Gravida Para Term Preterm AB TAB SAB Ectopic Multiple Living   2 1 1  0 0 0 0 0 0 1      Past Medical History: Past Medical History  Diagnosis Date  . Medical history non-contributory     Past Surgical History: Past Surgical History  Procedure Laterality Date  . No past surgeries      Social History: History   Social History  . Marital Status: Single    Spouse Name: N/A  . Number of Children: N/A  . Years of Education: N/A   Social History Main Topics  . Smoking status: Never Smoker   . Smokeless tobacco: Never Used  . Alcohol Use: No  . Drug Use: No  . Sexual Activity: Yes    Birth Control/ Protection: Pill   Other Topics Concern  . None   Social History Narrative    Allergies: Allergies  Allergen Reactions  . Amoxicillin Other (See Comments)    dizziness  . Mushroom Extract Complex Other (See Comments)    Faints     Prescriptions prior to admission  Medication Sig Dispense Refill Last Dose  . Prenatal Vit-Fe Fumarate-FA (PRENATAL MULTIVITAMIN) TABS Take 1 tablet by mouth daily at 12 noon.   04/09/2015 at Unknown time  . norethindrone (MICRONOR,CAMILA,ERRIN) 0.35 MG tablet Take 1 tablet (0.35 mg total) by mouth daily. 1 Package 11 More than a month at Unknown time    Review of Systems  Pertinent pos/neg as indicated in HPI  Physical Exam  Blood pressure 117/73, pulse 73, temperature 97.6 F (36.4 C), temperature source Oral, resp. rate 18, height 5\' 1"  (1.549 m), weight 74.05 kg (163 lb 4 oz),  last menstrual period 06/01/2014, currently breastfeeding. General appearance: alert, cooperative and no distress Lungs: clear to auscultation bilaterally, normal effort Heart: regular rate and rhythm Abdomen: gravid, soft, non-tender  Spec exam: cx visually closed, mod amount creamy white nonodorous d/c Cultures/Specimens: fFN, gc/ct, wet prep, gbs  Dilation: 1.5 Effacement (%): 50 Cervical Position: Posterior Station: -2 Presentation: Vertex Exam by:: kbooker, cnm Presentation: cephalic  Fetal monitoring: FHR: 135 bpm, variability: moderate,  Accelerations: Present,  decelerations:  Absent Uterine activity: q 2-5 on admission, now irregular and not perceived by pt  MAU Course  Exam UA Spec exam w/ wet prep, fFN, gc/ct, gbs Limited u/s for CL Procardia x 1, pt reports uc's completely went away PO fluids  Labs:  Results for orders placed or performed during the hospital encounter of 04/10/15 (from the past 24 hour(s))  Urinalysis, Routine w reflex microscopic ([facility])     Status: Abnormal   Collection Time: 04/10/15  1:50 AM  Result Value Ref Range   Color, Urine YELLOW YELLOW   APPearance CLEAR CLEAR   Specific Gravity, Urine <1.005 (L) 1.005 - 1.030   pH 5.5 5.0 - 8.0   Glucose, UA NEGATIVE NEGATIVE mg/dL   Hgb urine dipstick TRACE (A) NEGATIVE   Bilirubin Urine NEGATIVE NEGATIVE   Ketones, ur NEGATIVE NEGATIVE mg/dL   Protein, ur NEGATIVE NEGATIVE mg/dL   Urobilinogen, UA 0.2 0.0 -  1.0 mg/dL   Nitrite NEGATIVE NEGATIVE   Leukocytes, UA SMALL (A) NEGATIVE  Urine microscopic-add on     Status: Abnormal   Collection Time: 04/10/15  1:50 AM  Result Value Ref Range   Squamous Epithelial / LPF FEW (A) RARE   WBC, UA 3-6 <3 WBC/hpf   RBC / HPF 0-2 <3 RBC/hpf   Bacteria, UA RARE RARE  Group B strep by PCR     Status: Abnormal   Collection Time: 04/10/15  3:20 AM  Result Value Ref Range   Group B strep by PCR POSITIVE (A) NEGATIVE  Fetal fibronectin     Status:  None   Collection Time: 04/10/15  3:20 AM  Result Value Ref Range   Fetal Fibronectin NEGATIVE NEGATIVE  Wet prep, genital     Status: Abnormal   Collection Time: 04/10/15  3:20 AM  Result Value Ref Range   Yeast Wet Prep HPF POC NONE SEEN NONE SEEN   Trich, Wet Prep NONE SEEN NONE SEEN   Clue Cells Wet Prep HPF POC NONE SEEN NONE SEEN   WBC, Wet Prep HPF POC MODERATE (A) NONE SEEN  CBC     Status: Abnormal   Collection Time: 04/10/15  3:40 AM  Result Value Ref Range   WBC 13.2 (H) 4.0 - 10.5 K/uL   RBC 3.86 (L) 3.87 - 5.11 MIL/uL   Hemoglobin 10.3 (L) 12.0 - 15.0 g/dL   HCT 80.9 (L) 98.3 - 38.2 %   MCV 81.1 78.0 - 100.0 fL   MCH 26.7 26.0 - 34.0 pg   MCHC 32.9 30.0 - 36.0 g/dL   RDW 50.5 39.7 - 67.3 %   Platelets 332 150 - 400 K/uL    Imaging:  CL 2.1cm, cephalic  Assessment and Plan  A:  [redacted]w[redacted]d SIUP  G2P1001  Preterm uc's w/o further cervical change, neg fFN, and CL 2.1cm  Cat 1 FHR  GBS pcr pos P:  Discussed w/ Dr. Erin Fulling, OK to d/c home  Reviewed ptl s/s, fkc  Returning to [employer] today  To schedule appt w/ MD in [place] this week   Marge Duncans CNM,WHNP-BC 7/17/20165:23 AM

## 2015-04-10 NOTE — MAU Provider Note (Signed)
History  Chief Complaint:  No chief complaint on file.  Katelyn Joseph is a 29 y.o. G2P1001 female at [redacted]w[redacted]d presenting with contractions.   Reports active fetal movement, contractions: irregular, every 2-45 minutes, vaginal bleeding: none, membranes: intact. Denies uti s/s, last intercouse 04/01/15, uncomplicated prenatal course; 1st baby term vaginal. [facility], .  Obstetrical History: OB History    Gravida Para Term Preterm AB TAB SAB Ectopic Multiple Living   0 0 0 0 0 0 1      Past Medical History: Past Medical History  Diagnosis Date  . Medical history non-contributory     Past Surgical History: Past Surgical History  Procedure Laterality Date  . No past surgeries      Social History: History   Social History  . Marital Status: Single    Spouse Name: N/A  . Number of Children: N/A  . Years of Education: N/A   Social History Main Topics  . Smoking status: Never Smoker   . Smokeless tobacco: Never Used  . Alcohol Use: No  . Drug Use: No  . Sexual Activity: Yes    Birth Control/ Protection: Pill   Other Topics Concern  . None   Social History Narrative    Allergies: Allergies  Allergen Reactions  . Amoxicillin Other (See Comments)    dizziness  . Mushroom Extract Complex Other (See Comments)    Faints     Prescriptions prior to admission  Medication Sig Dispense Refill Last Dose  . Prenatal Vit-Fe Fumarate-FA (PRENATAL MULTIVITAMIN) TABS Take 1 tablet by mouth daily at 12 noon.   04/09/2015 at Unknown time  . norethindrone (MICRONOR,CAMILA,ERRIN) 0.35 MG tablet Take 1 tablet (0.35 mg total) by mouth daily. 1 Package 11 More than a month at Unknown time    Review of Systems  Pertinent pos/neg as indicated in HPI  Physical Exam  Blood pressure 111/71, pulse 78, temperature 97.6 F (36.4 C), temperature source Oral, resp. rate 18, height  (1.549 m), weight 74.05 kg (163 lb 4 oz), last menstrual period  06/01/2014, currently breastfeeding. General appearance: alert Abdomen: gravid, soft, non-tender  Spec exam: no bleeding, scant white discharge, no odor, no lesions Cultures/Specimens: UA, GBS swab, GC swab, Fetal fibronectin, wet prep Dilation: 1.5 Effacement (%): 50 Station: -2 Presentation: Vertex Exam by:: KIM B , CNM  Presentation: cephalic  Fetal monitoring: FHR: 130 bpm, variability: moderate,  Accelerations: Present,  decelerations:  Absent Uterine activity: irregular contractions  MAU Course  NST, CBC/HIV, ultrasound for cervical length, fetal fibronectin, GBS, Gonorrhea/Chlamydia, wet prep, Procardia 10 mg q 20 min for 3 doses, [facility].   Labs:  Results for orders placed or performed during the hospital encounter of 04/10/15 (from the past 24 hour(s))  Urinalysis, Routine w reflex microscopic (not at [facility])     Status: Abnormal   Collection Time: 04/10/15  1:50 AM  Result Value Ref Range   Color, Urine YELLOW YELLOW   APPearance CLEAR CLEAR   Specific Gravity, Urine <1.005 (L) 1.005 - 1.030   pH 5.5 5.0 - 8.0   Glucose, UA NEGATIVE NEGATIVE mg/dL   Hgb urine dipstick TRACE (A) NEGATIVE   Bilirubin Urine NEGATIVE NEGATIVE   Ketones, ur NEGATIVE NEGATIVE mg/dL   Protein, ur NEGATIVE NEGATIVE mg/dL   Urobilinogen, UA 0.2 0.0 - 1.0 mg/dL   Nitrite NEGATIVE NEGATIVE   Leukocytes, UA SMALL (A) NEGATIVE  Urine microscopic-add on  Status: Abnormal   Collection Time: 04/10/15  1:50 AM  Result Value Ref Range   Squamous Epithelial / LPF FEW (A) RARE   WBC, UA 3-6 <3 WBC/hpf   RBC / HPF 0-2 <3 RBC/hpf   Bacteria, UA RARE RARE    Imaging: transvaginal US for cervical length Assessment and Plan  A:  2375w0d SIUP  G2P1001    Cat 1 FHR P:    Reviewed  Keep next appt at on as scheduled   [name] SNM  7/17/20163:34 AM

## 2015-04-10 NOTE — Discharge Instructions (Signed)
Dolor abdominal durante el embarazo (Abdominal Pain During Pregnancy) El dolor de vientre (abdominal) es habitual durante el embarazo. Generalmente no se trata de un problema grave. Otras veces puede ser un signo de que algo no anda bien. Siempre comunquese con su mdico si tiene dolor abdominal. CUIDADOS EN EL HOGAR Controle el dolor para ver si hay cambios. Las indicaciones que siguen pueden ayudarla a sentirse mejor:  Hospital doctor (relaciones sexuales) ni se coloque nada dentro de la vagina hasta que se sienta mejor.  Haga reposo hasta que el dolor se calme.  Si siente ganas de vomitar (nuseas ) beba lquidos claros. No consuma alimentos slidos hasta que se sienta mejor.  Slo tome los medicamentos que le haya indicado su mdico.  Cumpla con las visitas al mdico segn las indicaciones. SOLICITE AYUDA DE INMEDIATO SI:   Tiene un sangrado, pierde lquido o elimina trozos de tejido por la vagina.  Siente ms dolor o clicos.  Comienza a vomitar.  Siente dolor al orinar u observa sangre en la orina.  Tiene fiebre.  No siente que el beb se mueva mucho.  Se siente muy dbil o cree que va a desmayarse.  Tiene dificultad para respirar con o sin dolor en el vientre.  Siente un dolor de cabeza muy intenso y Engineer, mining en el vientre.  Observa que sale un lquido por la vagina y tiene dolor abdominal.  La materia fecal es lquida (diarrea).  El dolor en el viente no desaparece, o empeora, luego de hacer reposo. ASEGRESE DE QUE:   Comprende estas instrucciones.  Controlar su afeccin.  Recibir ayuda de inmediato si no mejora o si empeora. Document Released: 05/23/2011 Document Revised: 05/13/2013 [facility] Patient Information 2015 [place], [place]. This information is not intended to replace advice given to you by your health care provider. Make sure you discuss any questions you have with your health care provider.  Contracciones de Designer, multimedia (Braxton Hicks  Contractions) Durante el Canadian, pueden presentarse contracciones uterinas que no siempre indican que est en [place].  QU SON LAS CONTRACCIONES DE BRAXTON HICKS?  Las [employer] se presentan antes del [place] de [place] se conocen como contracciones de [place] o falso trabajo de [place]. Hacia el final del embarazo (32 a 34semanas), estas contracciones pueden aparecen con ms frecuencia y volverse ms intensas. No corresponden al Aleen Campi de parto verdadero porque estas contracciones no producen el agrandamiento (la dilatacin) y el afinamiento del cuello del tero. Algunas veces, es difcil distinguirlas del trabajo de parto verdadero porque en algunos casos pueden ser [employer], y las personas tienen diferentes niveles de tolerancia al [employer]. No debe sentirse avergonzada si concurre al hospital con falso trabajo de [place]. En ocasiones, la nica forma de saber si el trabajo de parto es verdadero es que el mdico determine si hay cambios en el cuello del tero. Si no hay problemas prenatales u otras complicaciones de salud asociadas con el embarazo, no habr inconvenientes si la envan a su casa con falso trabajo de parto y espera que comience el verdadero. CMO DIFERENCIAR EL TRABAJO DE PARTO FALSO DEL VERDADERO Falso trabajo de parto  Las contracciones del falso trabajo de parto duran menos y no son tan intensas como las verdaderas.  Generalmente son irregulares.  A menudo, se sienten en la parte delantera de la parte baja del abdomen y en la ingle,  y pueden desaparecer cuando camina o cambia de posicin mientras est acostada.  Las contracciones se vuelven ms dbiles y  su duracin es Adult nurse a Biochemist, clinical.  Por lo general, no se hacen progresivamente ms intensas, regulares y Herbalist entre s como en el caso del [place] de parto verdadero. Theodis Blaze de parto  Las contracciones del verdadero trabajo de parto duran de 30 a 70segundos, son  muy regulares y suelen volverse ms intensas, y [place] su frecuencia.  No desaparecen cuando camina.  La molestia generalmente se siente en la parte superior del tero y se extiende hacia la zona inferior del abdomen y [employer] cintura.  El mdico podr examinarla para determinar si el trabajo de parto es verdadero. El examen mostrar si el cuello del tero se est dilatando y [place]. LO QUE DEBE RECORDAR  Contine haciendo los ejercicios habituales y siga otras indicaciones que el mdico le d.  Tome todos los medicamentos como le indic el mdico.  Oceanographer a las visitas prenatales regulares.  Coma y beba con moderacin si cree que est en trabajo de parto.  Si las contracciones de [employer] provocan incomodidad:  Cambie de posicin: si est acostada o descansando, camine; si est caminando, descanse.  Sintese y descanse en una baera con agua tibia.  Beba 2 o 3vasos de [place]. La deshidratacin puede provocar contracciones.  Respire lenta y profundamente varias veces por hora. CUNDO DEBO BUSCAR ASISTENCIA MDICA INMEDIATA? Solicite atencin mdica de inmediato si:  Las contracciones se intensifican, se hacen ms regulares y Arboriculturist s.  Tiene una prdida de lquido por la vagina.  Tiene fiebre.  Elimina mucosidad manchada con [place].  Tiene una hemorragia vaginal abundante.  Tiene dolor abdominal permanente.  Tiene un dolor en la zona lumbar que nunca tuvo antes.  Siente que la cabeza del beb empuja hacia abajo y ejerce presin en la zona plvica.  El beb no se mueve Dentist. Document Released: 06/20/2005 Document Revised: 09/15/2013 [facility] Patient Information 2015 [place], [place]. This information is not intended to replace advice given to you by your health care provider. Make sure you discuss any questions you have with your health care provider. Tercer trimestre del Psychiatrist  (Third Trimester of Pregnancy)  El tercer trimestre  del embarazo abarca desde la semana 29 hasta la semana 42, desde el 7 mes hasta el 9. En este trimestre el beb (feto) se desarrolla muy rpidamente. Hacia el final del noveno mes, el beb que an no ha nacido mide alrededor de 20 pulgadas (45 cm) de Equities trader. Y pesa entre 6 y 10 libras (2,700 y 4,500 kg).  CUIDADOS EN EL HOGAR   Evite fumar, consumir hierbas y beber alcohol. Evite los frmacos que no apruebe el mdico.  Slo tome los medicamentos que le haya indicado su mdico. Algunos medicamentos son seguros para tomar durante el Psychiatrist y otros no lo son.  Haga ejercicios slo como le indique el mdico. Deje de hacer ejercicios si comienza a tener clicos.  Haga comidas regulares y sanas.  Use un sostn que le brinde buen soporte si sus mamas estn sensibles.  No utilice la baera con agua caliente, baos turcos y saunas.  Colquese el cinturn de seguridad cuando conduzca.  Evite comer carne cruda y el contacto con los utensilios y desperdicios de los gatos.  Tome las vitaminas indicadas para la etapa prenatal.  Trate de tomar medicamentos para mover el intestino (laxantes) segn lo necesario y si su mdico la autoriza. Consuma ms fibra comiendo frutas y Sports administrator frescos y granos enteros. Beba gran cantidad de lquido para  mantener el pis (orina) de tono claro o amarillo plido.  Tome baos de agua tibia (baos de asiento) para Primary school teachercalmar el dolor o las molestias causadas por las hemorroides. Use una crema para las hemorroides si el mdico la autoriza.  Si tiene venas hinchadas y abultadas (venas varicosas), use medias de soporte. Eleve (levante) los pies durante 15 minutos, 3 o 4 veces por da. Limite el consumo de sal en su dieta.  Evite levantar objetos pesados, usar tacones altos y sintese derecha.  Descanse con las piernas elevadas si tiene calambres o dolor de cintura.  Visite a su dentista si no lo ha Occupational hygienisthecho durante el embarazo. Use un cepillo de dientes blando para  higienizarse los dientes. Use suavemente el hilo dental.  Puede tener sexo (relaciones sexuales) siempre que el mdico la autorice.  No viaje por largas distancias si puede evitarlo. Slo hgalo con la aprobacin de su mdico.  Haga el curso pre parto.  Practique conducir OfficeMax Incorporatedhasta el hospital.  Prepare el bolso que llevar.  Prepare la habitacin del beb.  Concurra a los controles mdicos. SOLICITE AYUDA SI:   No est segura si est en trabajo de parto o ha roto la bolsa de aguas.  Tiene mareos.  Siente clicos intensos o presin en la zona baja del vientre (abdomen).  Siente un dolor persistente en la zona del vientre.  Tiene Programme researcher, broadcasting/film/videomalestar estomacal (nuseas), devuelve (vomita), o tiene deposiciones acuosas (diarrea).  Advierte un olor ftido que proviene de la vagina.  Siente dolor al hacer pis (orinar). SOLICITE AYUDA DE INMEDIATO SI:   Tiene fiebre.  Pierde lquido o sangre por la vagina.  Tiene sangrando o pequeas prdidas vaginales.  Siente dolor intenso o clicos en el abdomen.  Sube o baja de peso rpidamente.  Tiene dificultad para respirar o Midwifesiente dolor en el pecho.  Sbitamente se le hinchan el rostro, las manos, los tobillos, los pies o las piernas.  No ha sentido los movimientos del beb durante Georgianne Fickuna hora.  Siente un dolor de cabeza intenso que no se alivia con medicamentos.  Su visin se modifica. Document Released: 05/13/2013 [facility] Patient Information 2015 [place]. This information is not intended to replace advice given to you by your health care provider. Make sure you discuss any questions you have with your health care provider.

## 2015-04-11 LAB — GC/CHLAMYDIA PROBE AMP (~~LOC~~) NOT AT ARMC
Chlamydia: NEGATIVE
Neisseria Gonorrhea: NEGATIVE

## 2015-04-12 DIAGNOSIS — O26873 Cervical shortening, third trimester: Secondary | ICD-10-CM | POA: Insufficient documentation

## 2015-10-28 IMAGING — US US ABDOMEN COMPLETE
1 series · 14 of 25 positions shown · non-contrast
Comparison: CT scan of the abdomen and pelvis of June 12, 2014.

CLINICAL DATA: Abdominal pain

EXAM:
ULTRASOUND ABDOMEN COMPLETE

[Series 1: us abdomen complete · 0.21mm/px · 14 of 62 slices shown]
[im 1/62]
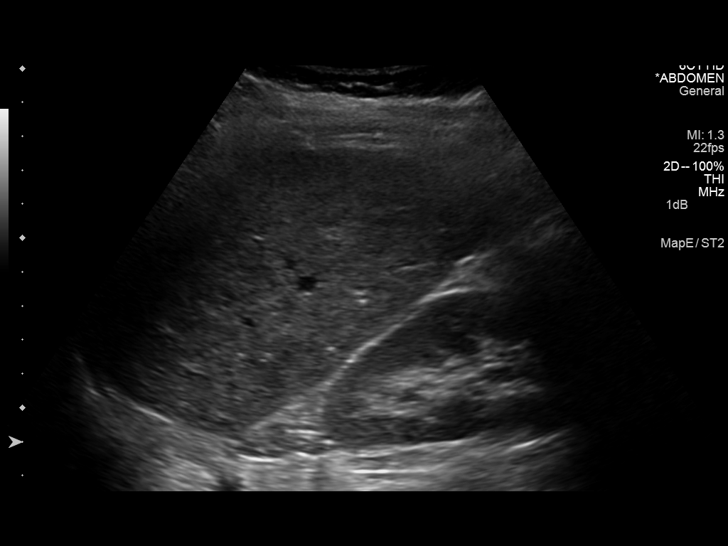
[im 6/62]
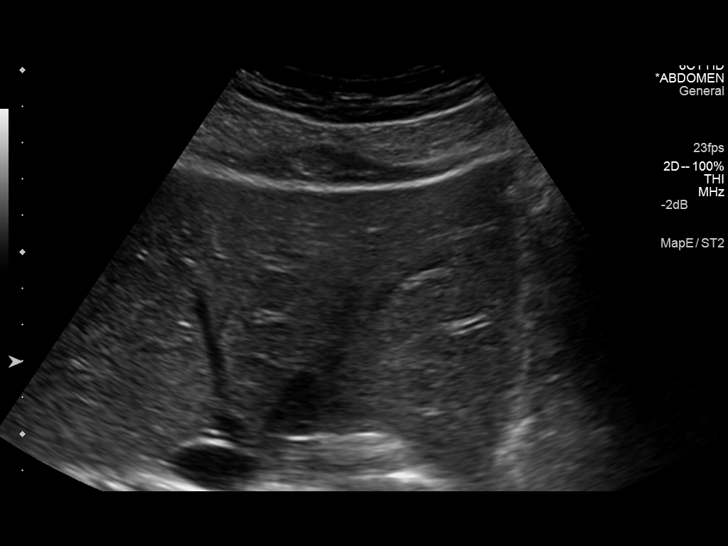
[im 11/62]
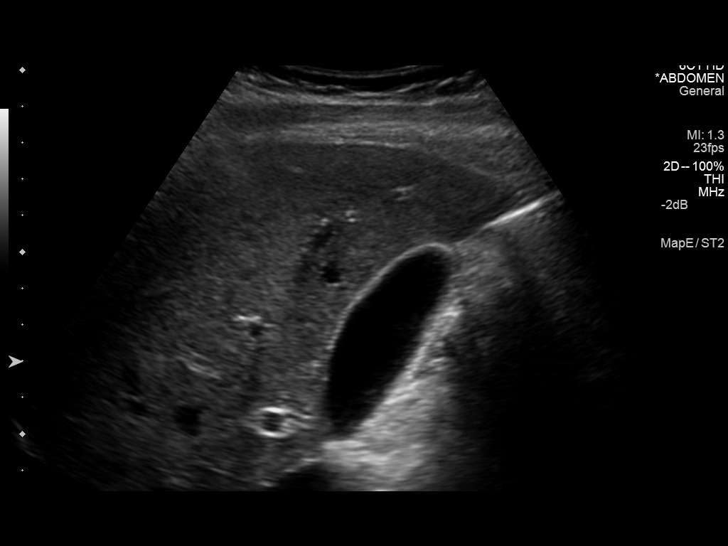
[im 16/62]
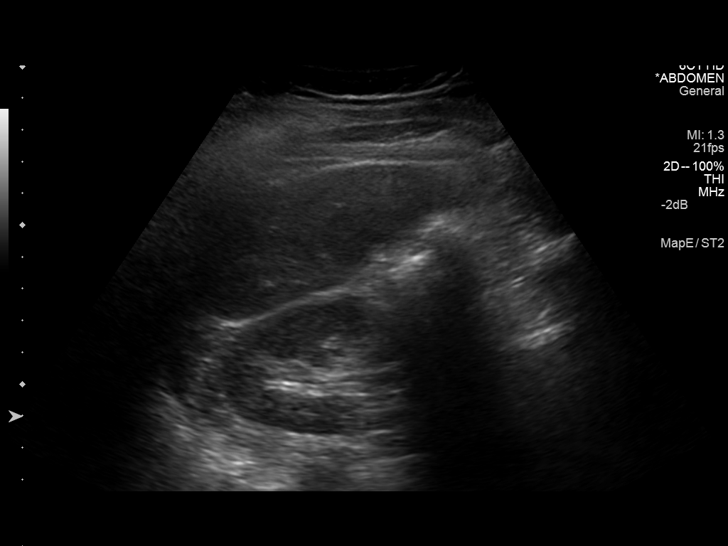
[im 21/62]
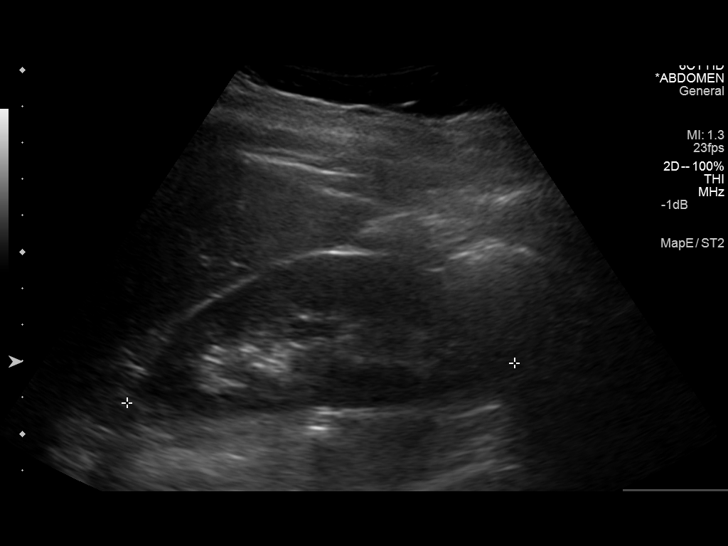
[im 23/62]
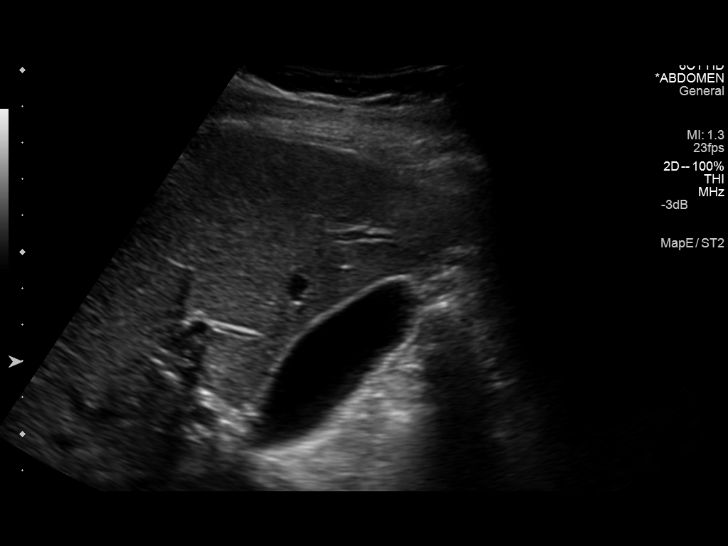
[im 28/62]
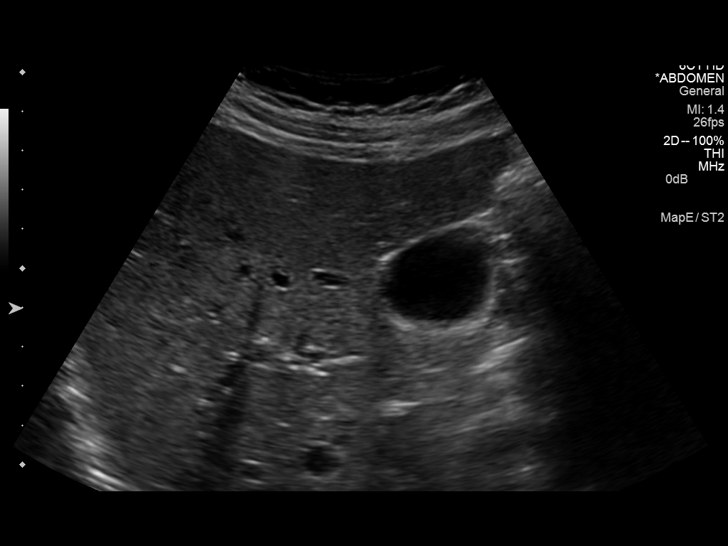
[im 34/62]
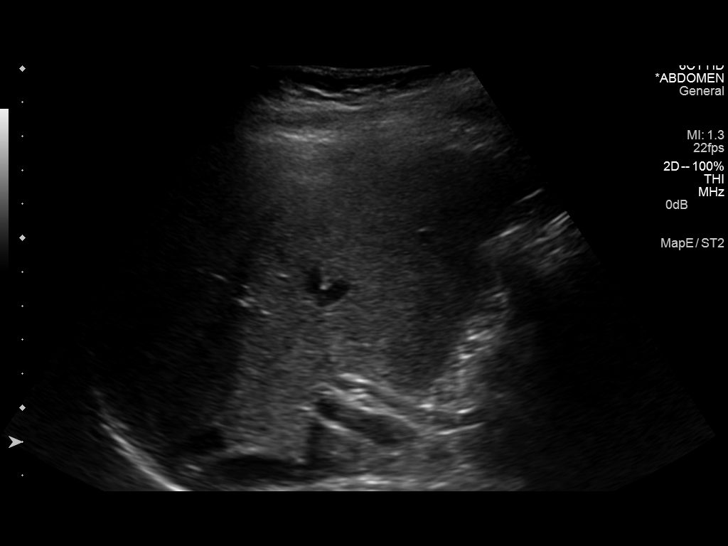
[im 39/62]
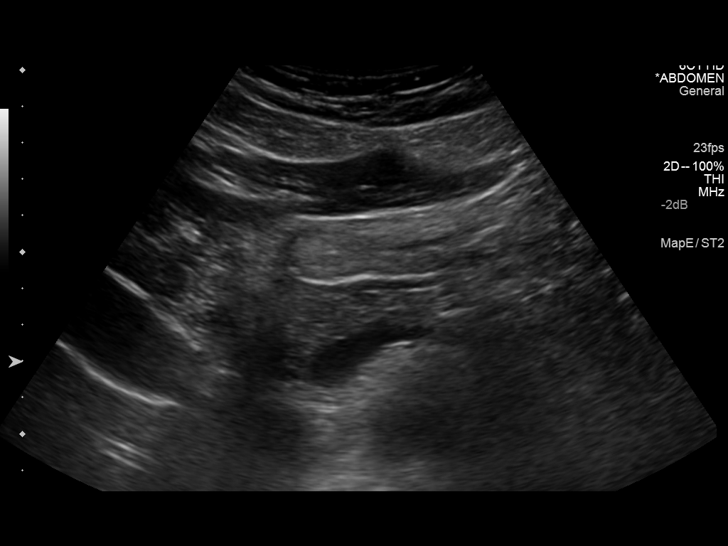
[im 41/62]
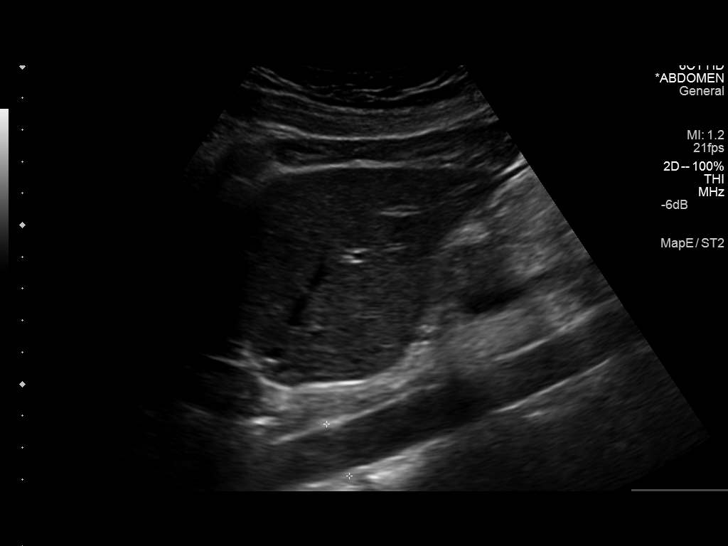
[im 46/62]
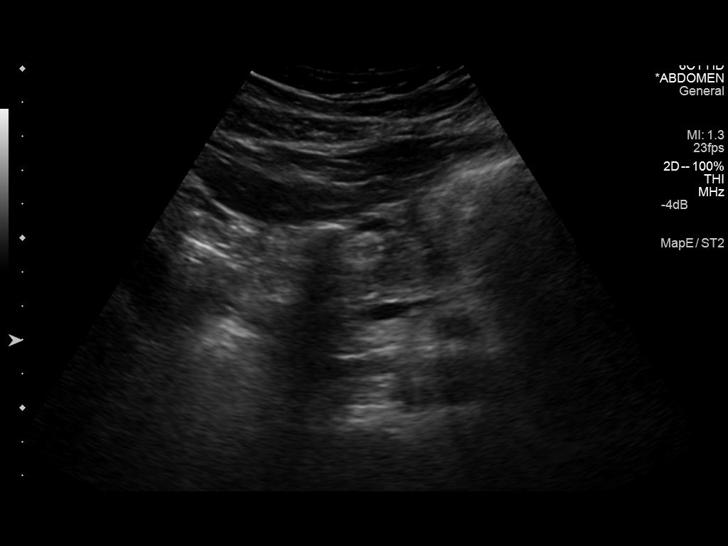
[im 51/62]
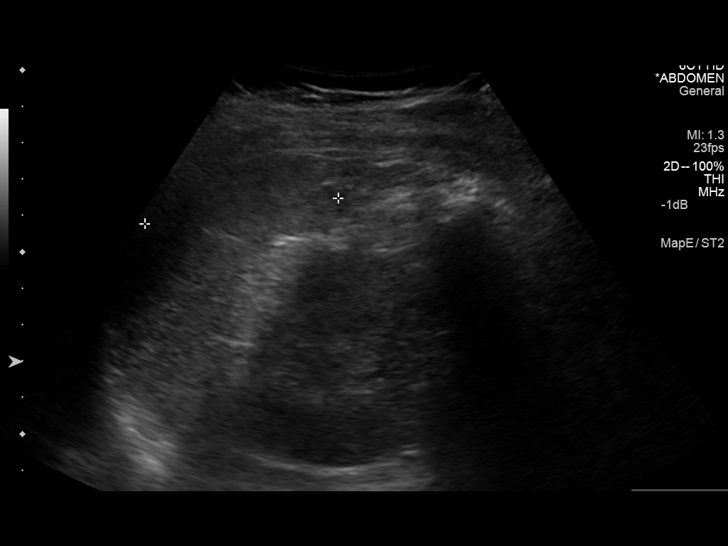
[im 56/62]
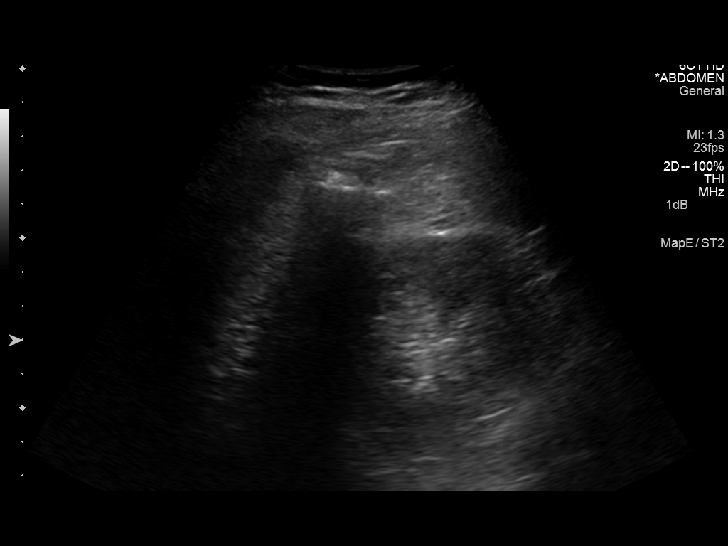
[im 62/62]
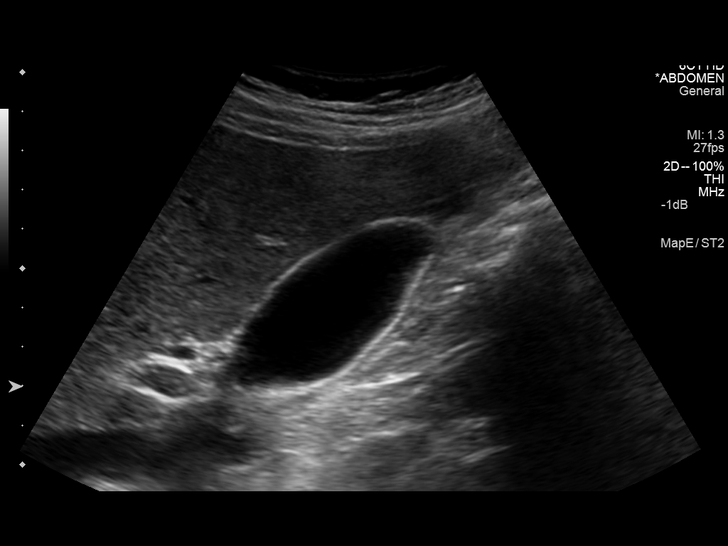

[14 of 25 positions shown; findings below may reference images not displayed]

FINDINGS: Gallbladder:

The gallbladder is adequately distended with no evidence of stones,
wall thickening, or pericholecystic fluid. There is no positive
sonographic Murphy's sign.

Common bile duct:

Diameter: 4 mm

Liver:

No focal lesion identified. Within normal limits in parenchymal
echogenicity.

IVC:

No abnormality visualized.

Pancreas:

The pancreatic tail was obscured by bowel gas. The pancreatic head
and body were unremarkable.

Spleen:

Size and appearance within normal limits.

Right Kidney:

Length: 10.7 cm. Echogenicity within normal limits. No mass or
hydronephrosis visualized.

Left Kidney:

Length: 12.0 cm. Echogenicity within normal limits. No mass or
hydronephrosis visualized.

Abdominal aorta:

No aneurysm visualized.

Other findings:

None.
IMPRESSION: There is no evidence of acute gallbladder pathology nor other acute
intra-abdominal abnormality.

## 2016-02-13 ENCOUNTER — Encounter (HOSPITAL_COMMUNITY): Payer: Self-pay | Admitting: *Deleted

## 2016-11-02 ENCOUNTER — Encounter (HOSPITAL_COMMUNITY): Payer: Self-pay
